# Patient Record
Sex: Male | Born: 1960 | Race: Black or African American | Hispanic: No | Smoking: Never smoker
Health system: Southern US, Community
[De-identification: ages and names within clinical notes are randomized; demographics above are authoritative.]

## PROBLEM LIST (undated history)

## (undated) ENCOUNTER — Emergency Department (HOSPITAL_COMMUNITY): Disposition: A | Discharge status: Home / Self Care | Payer: BC Managed Care – PPO

## (undated) DIAGNOSIS — J302 Other seasonal allergic rhinitis: Secondary | ICD-10-CM

## (undated) DIAGNOSIS — M1990 Unspecified osteoarthritis, unspecified site: Secondary | ICD-10-CM

## (undated) DIAGNOSIS — E7800 Pure hypercholesterolemia, unspecified: Secondary | ICD-10-CM

## (undated) DIAGNOSIS — I10 Essential (primary) hypertension: Secondary | ICD-10-CM

## (undated) HISTORY — PX: HERNIA REPAIR: SHX51

## (undated) HISTORY — PX: SHOULDER SURGERY: SHX246

## (undated) HISTORY — PX: ANKLE SURGERY: SHX546

## (undated) HISTORY — PX: KNEE SURGERY: SHX244

## (undated) HISTORY — PX: HAND SURGERY: SHX662

---

## 2013-05-01 ENCOUNTER — Encounter (HOSPITAL_COMMUNITY): Payer: Self-pay | Admitting: Emergency Medicine

## 2013-05-01 ENCOUNTER — Emergency Department (HOSPITAL_COMMUNITY): Payer: BC Managed Care – PPO

## 2013-05-01 ENCOUNTER — Emergency Department (HOSPITAL_COMMUNITY)
Admission: EM | Admit: 2013-05-01 | Discharge: 2013-05-01 | Disposition: A | Discharge status: Home / Self Care | Payer: BC Managed Care – PPO | Attending: Emergency Medicine | Admitting: Emergency Medicine

## 2013-05-01 DIAGNOSIS — S199XXA Unspecified injury of neck, initial encounter: Secondary | ICD-10-CM

## 2013-05-01 DIAGNOSIS — S335XXA Sprain of ligaments of lumbar spine, initial encounter: Secondary | ICD-10-CM | POA: Insufficient documentation

## 2013-05-01 DIAGNOSIS — S0990XA Unspecified injury of head, initial encounter: Secondary | ICD-10-CM | POA: Insufficient documentation

## 2013-05-01 DIAGNOSIS — S63502A Unspecified sprain of left wrist, initial encounter: Secondary | ICD-10-CM

## 2013-05-01 DIAGNOSIS — S0993XA Unspecified injury of face, initial encounter: Secondary | ICD-10-CM | POA: Insufficient documentation

## 2013-05-01 DIAGNOSIS — I10 Essential (primary) hypertension: Secondary | ICD-10-CM | POA: Insufficient documentation

## 2013-05-01 DIAGNOSIS — S39012A Strain of muscle, fascia and tendon of lower back, initial encounter: Secondary | ICD-10-CM

## 2013-05-01 DIAGNOSIS — Y939 Activity, unspecified: Secondary | ICD-10-CM | POA: Insufficient documentation

## 2013-05-01 DIAGNOSIS — Y9229 Other specified public building as the place of occurrence of the external cause: Secondary | ICD-10-CM | POA: Insufficient documentation

## 2013-05-01 DIAGNOSIS — Z8639 Personal history of other endocrine, nutritional and metabolic disease: Secondary | ICD-10-CM | POA: Insufficient documentation

## 2013-05-01 DIAGNOSIS — W1809XA Striking against other object with subsequent fall, initial encounter: Secondary | ICD-10-CM | POA: Insufficient documentation

## 2013-05-01 DIAGNOSIS — IMO0002 Reserved for concepts with insufficient information to code with codable children: Secondary | ICD-10-CM | POA: Insufficient documentation

## 2013-05-01 DIAGNOSIS — S63509A Unspecified sprain of unspecified wrist, initial encounter: Secondary | ICD-10-CM | POA: Insufficient documentation

## 2013-05-01 DIAGNOSIS — R42 Dizziness and giddiness: Secondary | ICD-10-CM | POA: Insufficient documentation

## 2013-05-01 DIAGNOSIS — Z862 Personal history of diseases of the blood and blood-forming organs and certain disorders involving the immune mechanism: Secondary | ICD-10-CM | POA: Insufficient documentation

## 2013-05-01 DIAGNOSIS — W010XXA Fall on same level from slipping, tripping and stumbling without subsequent striking against object, initial encounter: Secondary | ICD-10-CM | POA: Insufficient documentation

## 2013-05-01 HISTORY — DX: Essential (primary) hypertension: I10

## 2013-05-01 HISTORY — DX: Unspecified osteoarthritis, unspecified site: M19.90

## 2013-05-01 HISTORY — DX: Pure hypercholesterolemia, unspecified: E78.00

## 2013-05-01 MED ORDER — HYDROCODONE-ACETAMINOPHEN 5-325 MG PO TABS: 1.0000 | ORAL_TABLET | Freq: Four times a day (QID) | ORAL | Status: DC | PRN

## 2013-05-01 MED ORDER — IBUPROFEN 600 MG PO TABS: 600.0000 mg | ORAL_TABLET | Freq: Four times a day (QID) | ORAL | Status: DC | PRN

## 2013-05-01 MED ORDER — OXYCODONE-ACETAMINOPHEN 5-325 MG PO TABS
1.0000 | ORAL_TABLET | Freq: Once | ORAL | Status: AC
  Administered 2013-05-01: 1 via ORAL
  Filled 2013-05-01: qty 1

## 2013-05-01 NOTE — ED Notes (Signed)
Per EMS[facility], now reporting pain to right arm and lower back. Minor pain on palpation; no step offs. Denies neck pain AO x4. 177/109. Hx: HTN, states took RX this AM. 92 NSR. 100% RA.

## 2013-05-01 NOTE — Discharge Instructions (Signed)
Ice your wrist several times a day. Avoid the use until improved. Ibuprofen for pain. Norco for severe pain. Rest. Follow up with your doctor on Monday. Return if worsening.   Head Injury, Adult You have had a head injury that does not appear serious at this time. A concussion is a state of changed mental ability, usually from a blow to the head. You should take clear liquids for the rest of the day and then resume your regular diet. You should not take sedatives or alcoholic beverages for as long as directed by your caregiver after discharge. After injuries such as yours, most problems occur within the first 24 hours. SYMPTOMS These minor symptoms may be experienced after discharge:  Memory difficulties.  Dizziness.  Headaches.  Double vision.  Hearing difficulties.  Depression.  Tiredness.  Weakness.  Difficulty with concentration. If you experience any of these problems, you should not be alarmed. A concussion requires a few days for recovery. Many patients with head injuries frequently experience such symptoms. Usually, these problems disappear without medical care. If symptoms last for more than one day, notify your caregiver. See your caregiver sooner if symptoms are becoming worse rather than better. HOME CARE INSTRUCTIONS   During the next 24 hours you must stay with someone who can watch you for the warning signs listed below. Although it is unlikely that serious side effects will occur, you should be aware of signs and symptoms which may necessitate your return to this location. Side effects may occur up to 7  10 days following the injury. It is important for you to carefully monitor your condition and contact your caregiver or seek immediate medical attention if there is a change in your condition. SEEK IMMEDIATE MEDICAL CARE IF:   There is confusion or drowsiness.  You can not awaken the injured person.  There is nausea (feeling sick to your stomach) or continued,  forceful vomiting.  You notice dizziness or unsteadiness which is getting worse, or inability to walk.  You have convulsions or unconsciousness.  You experience severe, persistent headaches not relieved by over-the-counter or prescription medicines for pain. (Do not take aspirin as this impairs clotting abilities). Take other pain medications only as directed.  You can not use arms or legs normally.  There is clear or bloody discharge from the nose or ears. MAKE SURE YOU:   Understand these instructions.  Will watch your condition.  Will get help right away if you are not doing well or get worse. Document Released: 04/15/2005 Document Revised: 07/08/2011 Document Reviewed: 03/03/2009 [facility] Patient Information 2014 [place], [place].  Wrist Pain Wrist injuries are frequent in adults and children. A sprain is an injury to the ligaments that hold your bones together. A strain is an injury to muscle or muscle cord-like structures (tendons) from stretching or pulling. Generally, when wrists are moderately tender to touch following a fall or injury, a break in the bone (fracture) may be present. Most wrist sprains or strains are better in 3 to 5 days, but complete healing may take several weeks. HOME CARE INSTRUCTIONS   Put ice on the injured area.  Put ice in a plastic bag.  Place a towel between your skin and the bag.  Leave the ice on for 15-20 minutes, 03-04 times a day, for the first 2 days.  Keep your arm raised above the level of your heart whenever possible to reduce swelling and pain.  Rest the injured area for at least 48 hours or as  directed by your caregiver.  If a splint or elastic bandage has been applied, use it for as long as directed by your caregiver or until seen by a caregiver for a follow-up exam.  Only take over-the-counter or prescription medicines for pain, discomfort, or fever as directed by your caregiver.  Keep all follow-up appointments. You may need to  follow up with a specialist or have follow-up X-rays. Improvement in pain level is not a guarantee that you did not fracture a bone in your wrist. The only way to determine whether or not you have a broken bone is by X-ray. SEEK IMMEDIATE MEDICAL CARE IF:   Your fingers are swollen, very red, white, or cold and blue.  Your fingers are numb or tingling.  You have increasing pain.  You have difficulty moving your fingers. MAKE SURE YOU:   Understand these instructions.  Will watch your condition.  Will get help right away if you are not doing well or get worse. Document Released: 01/23/2005 Document Revised: 07/08/2011 Document Reviewed: 06/06/2010 [facility] Patient Information 2014 [place], [place].

## 2013-05-01 NOTE — ED Provider Notes (Signed)
Medical screening examination/treatment/procedure(s) were performed by non-physician practitioner and as supervising physician I was immediately available for consultation/collaboration.   Celene KrasJon R Ferron Ishmael, MD 05/01/13 2158

## 2013-05-01 NOTE — ED Provider Notes (Signed)
CSN: [id number]     Arrival date & time 05/01/13  1807 History   First MD Initiated Contact with Patient 05/01/13 1824     Chief Complaint  Patient presents with  . Fall   (Consider location/radiation/quality/duration/timing/severity/associated sxs/prior Treatment) HPI Manuel Melendez is a 53 y.o. male who presents emergency department after a fall. [facility] p[facility] of [facility] and fell backwards. He states he fell on to his back and then rolled down and hit his head on the ground. He states that bystanders helped him up. He denies any loss of consciousness. He states it's first felt "okay" but within the next few minutes he developed dizziness, lightheadedness, headache, and "fuzzy feeling." He also reports pain in his lower back and left wrist. He states after sitting there for a few minutes he was unable to get up due to dizziness and headaches. EMS was called and he was transported here. Patient denies being on blood thinners. He continues to have headache and dizziness. He reports mild pain in his neck, pain in his lower back and left wrist. Denies pain radiation. Pt did not take anything prior to the arrival.  Past Medical History  Diagnosis Date  . Hypertension   . Hypercholesteremia   . Arthritis    Past Surgical History  Procedure Laterality Date  . Shoulder surgery Bilateral   . Hernia repair     History reviewed. No pertinent family history. History  Substance Use Topics  . Smoking status: Never Smoker   . Smokeless tobacco: Not on file  . Alcohol Use: No    Review of Systems  Constitutional: Negative for fever and chills.  Respiratory: Negative for cough, chest tightness and shortness of breath.   Cardiovascular: Negative for chest pain, palpitations and leg swelling.  Gastrointestinal: Negative for nausea, vomiting, abdominal pain, diarrhea and abdominal distention.  Genitourinary: Negative for dysuria, urgency, frequency and hematuria.   Musculoskeletal: Positive for arthralgias, back pain, myalgias and neck pain. Negative for neck stiffness.  Skin: Negative for rash.  Allergic/Immunologic: Negative for immunocompromised state.  Neurological: Positive for dizziness, light-headedness and headaches. Negative for syncope, weakness and numbness.  All other systems reviewed and are negative.    Allergies  Review of patient's allergies indicates no known allergies.  Home Medications  No current outpatient prescriptions on file. BP 154/98  Pulse 87  Temp(Src) 98.4 F (36.9 C) (Oral)  SpO2 100% Physical Exam  Nursing note and vitals reviewed. Constitutional: He is oriented to person, place, and time. He appears well-developed and well-nourished. No distress.  HENT:  Head: Normocephalic and atraumatic.  Eyes: Conjunctivae and EOM are normal. Pupils are equal, round, and reactive to light.  Neck: Neck supple.  Cardiovascular: Normal rate, regular rhythm and normal heart sounds.   Pulmonary/Chest: Effort normal. No respiratory distress. He has no wheezes. He has no rales.  Abdominal: Soft. Bowel sounds are normal. He exhibits no distension. There is no tenderness. There is no rebound.  Musculoskeletal: He exhibits no edema.  Tenderness to the midline and left perivertebral lumbar spine. No bruising, swelling, stepoffs. Pain with bilateral straight leg raise. Mild swelling and tenderness over left dorsal wrist. Pain with any ROM of the wrist. 5/5 and equal lower extremity strength. 2+ and equal patellar reflexes bilaterally. Pt able to dorsiflex bilateral toes and feet with good strength against resistance. Equal sensation bilaterally over thighs and lower legs.   Neurological: He is alert and oriented to person, place,  and time. No cranial nerve deficit. Coordination normal.  5/5 and equal upper and lower extremity strength bilaterally. Equal grip strength bilaterally. Normal finger to nose and heel to shin. No pronator drift.    Skin: Skin is warm and dry.    ED Course  Procedures (including critical care time) Labs Review Labs Reviewed - No data to display Imaging Review Dg Lumbar Spine Complete  05/01/2013   CLINICAL DATA:  Fall today.  Low back pain.  EXAM: LUMBAR SPINE - COMPLETE 4+ VIEW  COMPARISON:  None.  FINDINGS: There is transitional lumbosacral anatomy. There are small ribs at L1 with 5 lumbar type vertebral bodies. The alignment is normal. There is no evidence of fracture or pars defect. Mild intervertebral spurring is present at the lower 3 levels. There is mild facet disease inferiorly as well.  IMPRESSION: No evidence of acute lumbar spine injury or malalignment.   Electronically Signed   By: Roxy Horseman M.D.   On: 05/01/2013 20:05   Dg Wrist Complete Left  05/01/2013   CLINICAL DATA:  Wrist pain status post fall.  EXAM: LEFT WRIST - COMPLETE 3+ VIEW  COMPARISON:  None.  FINDINGS: The mineralization and alignment are normal. There is no evidence of acute fracture or dislocation. The joint spaces are maintained. There appears to be some dorsal soft tissue swelling.  IMPRESSION: No acute osseous findings identified. Dorsal soft tissue swelling noted.   Electronically Signed   By: Roxy Horseman M.D.   On: 05/01/2013 20:06   Ct Head Wo Contrast  05/01/2013   CLINICAL DATA:  Fall.  Head injury.  Neck pain.  EXAM: CT HEAD WITHOUT CONTRAST  CT CERVICAL SPINE WITHOUT CONTRAST  TECHNIQUE: Multidetector CT imaging of the head and cervical spine was performed following the standard protocol without intravenous contrast. Multiplanar CT image reconstructions of the cervical spine were also generated.  COMPARISON:  None.  FINDINGS: CT HEAD FINDINGS  No evidence of intracranial hemorrhage, brain edema, or other signs of acute infarction. No evidence of intracranial mass lesion or mass effect. No abnormal extraaxial fluid collections identified. Ventricles are normal in size. No skull abnormality identified.  CT CERVICAL SPINE  FINDINGS  No evidence of acute fracture, subluxation, or prevertebral soft tissue swelling.  Mild degenerative disc disease seen from levels C3-C7. Mild cervical kyphosis noted. No evidence of facet DJD or other significant bone abnormality.  IMPRESSION: Negative noncontrast head CT.  No evidence of acute cervical spine fracture or subluxation. Mild cervical degenerative disc disease.   Electronically Signed   By: Myles Rosenthal M.D.   On: 05/01/2013 20:23   Ct Cervical Spine Wo Contrast  05/01/2013   CLINICAL DATA:  Fall.  Head injury.  Neck pain.  EXAM: CT HEAD WITHOUT CONTRAST  CT CERVICAL SPINE WITHOUT CONTRAST  TECHNIQUE: Multidetector CT imaging of the head and cervical spine was performed following the standard protocol without intravenous contrast. Multiplanar CT image reconstructions of the cervical spine were also generated.  COMPARISON:  None.  FINDINGS: CT HEAD FINDINGS  No evidence of intracranial hemorrhage, brain edema, or other signs of acute infarction. No evidence of intracranial mass lesion or mass effect. No abnormal extraaxial fluid collections identified. Ventricles are normal in size. No skull abnormality identified.  CT CERVICAL SPINE FINDINGS  No evidence of acute fracture, subluxation, or prevertebral soft tissue swelling.  Mild degenerative disc disease seen from levels C3-C7. Mild cervical kyphosis noted. No evidence of facet DJD or other significant bone abnormality.  IMPRESSION:  Negative noncontrast head CT.  No evidence of acute cervical spine fracture or subluxation. Mild cervical degenerative disc disease.   Electronically Signed   By: Myles Rosenthal M.D.   On: 05/01/2013 20:23    EKG Interpretation   None       MDM   1. Minor head injury, initial encounter   2. Lumbar spine strain, initial encounter   3. Wrist sprain, left, initial encounter     Patient emergency department with mechanical fall after slipping and falling on water. Patient is complaining of headache,  dizziness, pain in the wrist and lower back. X-rays of the wrist and lumbar spine obtained and are negative. CT of the head and cervical spine obtained and are negative. Patient treated emergency department with a Percocet. I suspect his headache is due to his head injury and possible mild concussion. He'll be discharged home with pain medications and NSAIDs, followup with primary care Dr. on Monday. His vital signs are normal except for mild hypertension, last blood pressure is 142/94. Results and plan discussed with patient, will followup.  Filed Vitals:   05/01/13 1814 05/01/13 1900 05/01/13 2030  BP: 154/98 155/100 142/94  Pulse: 87 75 64  Temp: 98.4 F (36.9 C)    TempSrc: Oral    SpO2: 100% 100% 100%     Lottie Mussel, PA-C 05/01/13 2141

## 2013-09-28 ENCOUNTER — Encounter (HOSPITAL_COMMUNITY): Payer: Self-pay | Admitting: Emergency Medicine

## 2013-09-28 ENCOUNTER — Emergency Department (HOSPITAL_COMMUNITY)
Admission: EM | Admit: 2013-09-28 | Discharge: 2013-09-28 | Disposition: A | Discharge status: Home / Self Care | Payer: BC Managed Care – PPO | Attending: Emergency Medicine | Admitting: Emergency Medicine

## 2013-09-28 ENCOUNTER — Emergency Department (HOSPITAL_COMMUNITY): Payer: BC Managed Care – PPO

## 2013-09-28 DIAGNOSIS — E7800 Pure hypercholesterolemia, unspecified: Secondary | ICD-10-CM | POA: Insufficient documentation

## 2013-09-28 DIAGNOSIS — I10 Essential (primary) hypertension: Secondary | ICD-10-CM | POA: Insufficient documentation

## 2013-09-28 DIAGNOSIS — J45909 Unspecified asthma, uncomplicated: Secondary | ICD-10-CM | POA: Insufficient documentation

## 2013-09-28 DIAGNOSIS — IMO0001 Reserved for inherently not codable concepts without codable children: Secondary | ICD-10-CM | POA: Insufficient documentation

## 2013-09-28 DIAGNOSIS — Z79899 Other long term (current) drug therapy: Secondary | ICD-10-CM | POA: Insufficient documentation

## 2013-09-28 DIAGNOSIS — Z23 Encounter for immunization: Secondary | ICD-10-CM | POA: Insufficient documentation

## 2013-09-28 DIAGNOSIS — L02519 Cutaneous abscess of unspecified hand: Secondary | ICD-10-CM | POA: Insufficient documentation

## 2013-09-28 DIAGNOSIS — L03019 Cellulitis of unspecified finger: Secondary | ICD-10-CM | POA: Insufficient documentation

## 2013-09-28 DIAGNOSIS — L03012 Cellulitis of left finger: Secondary | ICD-10-CM

## 2013-09-28 DIAGNOSIS — Z792 Long term (current) use of antibiotics: Secondary | ICD-10-CM | POA: Insufficient documentation

## 2013-09-28 DIAGNOSIS — M129 Arthropathy, unspecified: Secondary | ICD-10-CM | POA: Insufficient documentation

## 2013-09-28 DIAGNOSIS — Z8639 Personal history of other endocrine, nutritional and metabolic disease: Secondary | ICD-10-CM | POA: Insufficient documentation

## 2013-09-28 DIAGNOSIS — Z862 Personal history of diseases of the blood and blood-forming organs and certain disorders involving the immune mechanism: Secondary | ICD-10-CM | POA: Insufficient documentation

## 2013-09-28 DIAGNOSIS — Z0289 Encounter for other administrative examinations: Secondary | ICD-10-CM | POA: Insufficient documentation

## 2013-09-28 MED ORDER — CEPHALEXIN 250 MG PO CAPS: 250.0000 mg | ORAL_CAPSULE | Freq: Four times a day (QID) | ORAL | Status: DC

## 2013-09-28 MED ORDER — LIDOCAINE HCL (PF) 1 % IJ SOLN
INTRAMUSCULAR | Status: AC
  Administered 2013-09-28: 05:00:00
  Filled 2013-09-28: qty 5

## 2013-09-28 MED ORDER — TETANUS-DIPHTH-ACELL PERTUSSIS 5-2.5-18.5 LF-MCG/0.5 IM SUSP
0.5000 mL | Freq: Once | INTRAMUSCULAR | Status: AC
  Administered 2013-09-28: 0.5 mL via INTRAMUSCULAR
  Filled 2013-09-28: qty 0.5

## 2013-09-28 NOTE — Discharge Instructions (Signed)
Please report back to the ED tomorrow for the wound to be re-assessed Please rest and stay hydrated Please soak the finger in warm water and massage at least 3 times per day Please avoid any physical or strenuous activity Please monitor symptoms closely and if symptoms are to worsen or change (fever greater than 101, chills, sweating, nausea, vomiting, chest pain, shortness of breath, difficulty breathing, swelling to the finger, red streaks, hot to the touch, bleeding, drainage of pus, weakness, numbness, tingling, loss of sensation to the finger, fall, injury) please report back to the ED immediately    Cellulitis Cellulitis is an infection of the skin and the tissue under the skin. The infected area is usually red and tender. This happens most often in the arms and lower legs. HOME CARE   Take your antibiotic medicine as told. Finish the medicine even if you start to feel better.  Keep the infected arm or leg raised (elevated).  Put a warm cloth on the area up to 4 times per day.  Only take medicines as told by your doctor.  Keep all doctor visits as told. GET HELP RIGHT AWAY IF:   You have a fever.  You feel very sleepy.  You throw up (vomit) or have watery poop (diarrhea).  You feel sick and have muscle aches and pains.  You see red streaks on the skin coming from the infected area.  Your red area gets bigger or turns a dark color.  Your bone or joint under the infected area is painful after the skin heals.  Your infection comes back in the same area or different area.  You have a puffy (swollen) bump in the infected area.  You have new symptoms. MAKE SURE YOU:   Understand these instructions.  Will watch your condition.  Will get help right away if you are not doing well or get worse. Document Released: 10/02/2007 Document Revised: 10/15/2011 Document Reviewed: 07/01/2011 [facility] Patient Information 2014 [place], [place].   Paronychia Paronychia is an  inflammatory reaction involving the folds of the skin surrounding the fingernail. This is commonly caused by an infection in the skin around a nail. The most common cause of paronychia is frequent wetting of the hands (as seen with bartenders, food servers, nurses or others who wet their hands). This makes the skin around the fingernail susceptible to infection by bacteria (germs) or fungus. Other predisposing factors are:  Aggressive manicuring.  Nail biting.  Thumb sucking. The most common cause is a staphylococcal (a type of germ) infection, or a fungal (Candida) infection. When caused by a germ, it usually comes on suddenly with redness, swelling, pus and is often painful. It may get under the nail and form an abscess (collection of pus), or form an abscess around the nail. If the nail itself is infected with a fungus, the treatment is usually prolonged and may require oral medicine for up to one year. Your caregiver will determine the length of time treatment is required. The paronychia caused by bacteria (germs) may largely be avoided by not pulling on hangnails or picking at cuticles. When the infection occurs at the tips of the finger it is called felon. When the cause of paronychia is from the herpes simplex virus (HSV) it is called herpetic whitlow. TREATMENT  When an abscess is present treatment is often incision and drainage. This means that the abscess must be cut open so the pus can get out. When this is done, the following home care instructions  should be followed. HOME CARE INSTRUCTIONS   It is important to keep the affected fingers very dry. Rubber or plastic gloves over cotton gloves should be used whenever the hand must be placed in water.  Keep wound clean, dry and dressed as suggested by your caregiver between warm soaks or warm compresses.  Soak in warm water for fifteen to twenty minutes three to four times per day for bacterial infections. Fungal infections are very difficult  to treat, so often require treatment for long periods of time.  For bacterial (germ) infections take antibiotics (medicine which kill germs) as directed and finish the prescription, even if the problem appears to be solved before the medicine is gone.  Only take over-the-counter or prescription medicines for pain, discomfort, or fever as directed by your caregiver. SEEK IMMEDIATE MEDICAL CARE IF:  You have redness, swelling, or increasing pain in the wound.  You notice pus coming from the wound.  You have a fever.  You notice a bad smell coming from the wound or dressing. Document Released: 10/09/2000 Document Revised: 07/08/2011 Document Reviewed: 06/10/2008 [facility] Patient Information 2014 [place], [place].

## 2013-09-28 NOTE — ED Provider Notes (Signed)
Medical screening examination/treatment/procedure(s) were conducted as a shared visit with non-physician practitioner(s) and myself.  I personally evaluated the patient during the encounter.  Pt with recent pain, swelling to left fifth finger.  He has been digging in the finger, concerned that he had a splinter, unable to extract   EKG Interpretation None      INCISION AND DRAINAGE Performed by: Olivia Mackie Consent: Verbal consent obtained. Risks and benefits: risks, benefits and alternatives were discussed Type: abscess  Body area: left lateral distal 5th finger  Anesthesia: digital block  Incision was made with a scalpel.  Local anesthetic: lidocaine 1% without epinephrine  Anesthetic total: 3 ml  Complexity: complex Blunt dissection to break up loculations.  No foreign bodies noted  Drainage: purulent  Drainage amount:small  Packing material: none  Patient tolerance: Patient tolerated the procedure well with no immediate complications.     Olivia Mackie, MD 09/28/13 [phone]

## 2013-09-28 NOTE — ED Provider Notes (Signed)
CSN: [id number]     Arrival date & time 09/28/13  0130 History   First MD Initiated Contact with Patient 09/28/13 0228     Chief Complaint  Patient presents with  . Hand Pain     (Consider location/radiation/quality/duration/timing/severity/associated sxs/prior Treatment) The history is provided by the patient. No language interpreter was used.  Manuel Melendez is a 53 year old male with past medical history of hypertension, hypercholesterolemia, asthma presenting to the ED with left small finger discomfort that has been ongoing for the past week. Patient reported approximately one week ago he was lifting wood towards and stated that he believes he had a splinter in his left pinky. Stated that he tried to remove the splinter but was unsuccessful. Stated that over the past week he has had progressive swelling to the left pinky finger and the pain is gone progressively worse described as a constant throbbing sensation. Reported that there is mild clear fluid drainage when he tries to remove the splinter. Stated that he only start to soak his finger in warm water today. Denied numbness, tingling, loss of sensation, bleeding, pus drainage. PCP Dr. Margo Aye  Past Medical History  Diagnosis Date  . Hypertension   . Hypercholesteremia   . Arthritis    Past Surgical History  Procedure Laterality Date  . Shoulder surgery Bilateral   . Hernia repair     No family history on file. History  Substance Use Topics  . Smoking status: Never Smoker   . Smokeless tobacco: Not on file  . Alcohol Use: No    Review of Systems  Musculoskeletal: Positive for arthralgias (left small finger).  Neurological: Negative for weakness and numbness.      Allergies  Review of patient's allergies indicates no known allergies.  Home Medications   Prior to Admission medications   Medication Sig Start Date End Date Taking? Authorizing Provider  dexlansoprazole (DEXILANT) 60 MG capsule Take 60 mg by mouth daily.    Yes Historical Provider, MD  oxyCODONE-acetaminophen (PERCOCET) 10-325 MG per tablet Take 1 tablet by mouth every 4 (four) hours as needed for pain.   Yes Historical Provider, MD  PRESCRIPTION MEDICATION Take 1 tablet by mouth daily. Combo blood pressure medication   Yes Historical Provider, MD  tamsulosin (FLOMAX) 0.4 MG CAPS capsule Take 0.4 mg by mouth daily.   Yes Historical Provider, MD   There were no vitals taken for this visit. Physical Exam  Nursing note and vitals reviewed. Constitutional: He is oriented to person, place, and time. He appears well-developed and well-nourished. No distress.  HENT:  Head: Normocephalic and atraumatic.  Eyes: Conjunctivae and EOM are normal. Right eye exhibits no discharge. Left eye exhibits no discharge.  Neck: Normal range of motion. Neck supple.  Cardiovascular: Normal rate, regular rhythm and normal heart sounds.  Exam reveals no friction rub.   No murmur heard. Pulses:      Radial pulses are 2+ on the right side, and 2+ on the left side.  Cap refill less than 3 seconds  Pulmonary/Chest: Effort normal and breath sounds normal. No respiratory distress. He has no wheezes. He has no rales.  Musculoskeletal: Normal range of motion.  Swelling, erythema identified to the distal phalanx of the left small finger. Negative active drainage, bleeding, serous fluid identified. Discomfort upon palpation to the finger pad. Full flexion extension identified to the left small finger. Negative warmth upon palpation. Negative red streaks running down the finger.   Neurological: He is alert and oriented to person,  place, and time. No cranial nerve deficit. He exhibits normal muscle tone. Coordination normal.  Cranial nerves III-XII grossly intact Strength 5+/5+ to upper extremities bilaterally with resistance applied, equal distribution noted Strength intact to MCP, PIP, DIP joints of left hand Equal grip strength bilaterally Sensation intact with differentiation  sharp and dull touch  Skin: Skin is warm and dry. He is not diaphoretic. There is erythema.  Psychiatric: He has a normal mood and affect. His behavior is normal. Thought content normal.    ED Course  Procedures (including critical care time) Labs Review Labs Reviewed - No data to display  Imaging Review Dg Finger Little Left  09/28/2013   CLINICAL DATA:  Little finger pain after recent splinter.  EXAM: LEFT LITTLE FINGER 2+V  COMPARISON:  None.  FINDINGS: There is no evidence of fracture or dislocation. There is no evidence of arthropathy or other focal bone abnormality. Focal soft tissue swelling of the distal fifth phalanx without subcutaneous gas or radiopaque foreign bodies.  IMPRESSION: Focal fifth distal phalanx soft tissue swelling without subcutaneous gas or radiopaque foreign bodies.  No acute fracture deformity or dislocation.   Electronically Signed   By: Awilda Metroourtnay  Bloomer   On: 09/28/2013 03:24     EKG Interpretation None      MDM   Final diagnoses:  None   Plain film of left little finger noted focal fifth distal phalanx soft tissue swelling without subcutaneous gas or radiopaque foreign bodies-no acute fracture deformity or dislocation noted. Patient seen and assessed by attending physician, Dr. Norlene Campbelltter who recommended incision and drainage to be performed-I&D to be performed by attending physician. Transfer of care to attending physician, discharge paperwork complete with plan for discharge with antibiotics and one day follow up.     Raymon MuttonMarissa Edword Cu, PA-C 09/28/13 0401

## 2013-09-28 NOTE — ED Notes (Signed)
Patient with left pinky pain.  Patient states that he may have a splinter in the pinky, he has been trying to get it out of the pinky.  Area is swollen, painful and serous drainage seen from area.

## 2013-09-28 NOTE — ED Notes (Signed)
Pt. Transported to xray 

## 2013-09-29 ENCOUNTER — Encounter (HOSPITAL_COMMUNITY): Payer: Self-pay | Admitting: Emergency Medicine

## 2013-09-29 ENCOUNTER — Emergency Department (HOSPITAL_COMMUNITY)
Admission: EM | Admit: 2013-09-29 | Discharge: 2013-09-29 | Disposition: A | Discharge status: Home / Self Care | Payer: BC Managed Care – PPO | Attending: Emergency Medicine | Admitting: Emergency Medicine

## 2013-09-29 DIAGNOSIS — L03012 Cellulitis of left finger: Secondary | ICD-10-CM

## 2013-09-29 NOTE — Discharge Instructions (Signed)
Please read and follow all provided instructions.  Your diagnoses today include:  1. Paronychia of finger of left hand     Tests performed today include:  Vital signs. See below for your results today.   Medications prescribed:   None  Take any prescribed medications only as directed.   Home care instructions:   Follow any educational materials contained in this packet  Continue antibiotics and pain medications.   Soak your finger twice a day in warm water.  Follow-up instructions: See your primary care doctor in 48 hours for a recheck if your symptoms are not significantly improved.  Return instructions:  Return to the Emergency Department if you have:  Fever  Worsening symptoms  Worsening pain  Worsening swelling  Redness of the skin that moves away from the affected area, especially if it streaks away from the affected area   Any other emergent concerns  Your vital signs today were: BP 148/99   Pulse 76   Temp(Src) 97.7 F (36.5 C) (Oral)   Resp 18   SpO2 98% If your blood pressure (BP) was elevated above 135/85 this visit, please have this repeated by your doctor within one month. --------------

## 2013-09-29 NOTE — ED Notes (Signed)
Left hand pinky finger wound recheck. Pt states it looks much better

## 2013-09-29 NOTE — ED Provider Notes (Signed)
Medical screening examination/treatment/procedure(s) were performed by non-physician practitioner and as supervising physician I was immediately available for consultation/collaboration.   EKG Interpretation None        Braxen Dobek M Markesia Crilly, MD 09/29/13 0503 

## 2013-09-29 NOTE — ED Provider Notes (Signed)
CSN: [id number]     Arrival date & time 09/28/13  2355 History   First MD Initiated Contact with Patient 09/29/13 0043     Chief Complaint  Patient presents with  . Wound Check     (Consider location/radiation/quality/duration/timing/severity/associated sxs/prior Treatment) HPI Comments: Patient presents for wound recheck after having incision and drainage performed of a left fifth digit paronychia. Patient was started on Keflex. He is on pain medication at home. Patient is at the swelling is still present but improving. Pain is much better than it was yesterday. He denies any fever or other systemic symptoms of illness. Onset of symptoms gradual. Course is improving. Nothing makes symptoms better or worse  Patient is a 53 y.o. male presenting with wound check. The history is provided by the patient.  Wound Check Associated symptoms include myalgias. Pertinent negatives include no arthralgias, joint swelling, neck pain, numbness or weakness.    Past Medical History  Diagnosis Date  . Hypertension   . Hypercholesteremia   . Arthritis    Past Surgical History  Procedure Laterality Date  . Shoulder surgery Bilateral   . Hernia repair     History reviewed. No pertinent family history. History  Substance Use Topics  . Smoking status: Never Smoker   . Smokeless tobacco: Not on file  . Alcohol Use: No    Review of Systems  Constitutional: Negative for activity change.  Musculoskeletal: Positive for myalgias. Negative for arthralgias, back pain, gait problem, joint swelling and neck pain.  Skin: Negative for color change and wound.  Neurological: Negative for weakness and numbness.      Allergies  Review of patient's allergies indicates no known allergies.  Home Medications   Prior to Admission medications   Medication Sig Start Date End Date Taking? Authorizing Provider  cephALEXin (KEFLEX) 250 MG capsule Take 1 capsule (250 mg total) by mouth 4 (four) times daily. 09/28/13   Yes Marissa Sciacca, PA-C  dexlansoprazole (DEXILANT) 60 MG capsule Take 60 mg by mouth daily.   Yes Historical Provider, MD  oxyCODONE-acetaminophen (PERCOCET) 10-325 MG per tablet Take 1 tablet by mouth every 4 (four) hours as needed for pain.   Yes Historical Provider, MD  PRESCRIPTION MEDICATION Take 1 tablet by mouth daily. Combo blood pressure medication   Yes Historical Provider, MD  tamsulosin (FLOMAX) 0.4 MG CAPS capsule Take 0.4 mg by mouth daily.   Yes Historical Provider, MD   BP 134/89  Pulse 70  Temp(Src) 97.7 F (36.5 C) (Oral)  Resp 16  SpO2 100%  Physical Exam  Nursing note and vitals reviewed. Constitutional: He appears well-developed and well-nourished.  HENT:  Head: Normocephalic and atraumatic.  Eyes: Conjunctivae are normal.  Neck: Normal range of motion. Neck supple.  Cardiovascular: Normal pulses.   Musculoskeletal: He exhibits edema and tenderness.  Patient with healed incision wound noted to the ulnar aspect of the left fifth digit. There is mild swelling of the paronychia. No erythema or warmth. Area appears to be well-healing. Normal capillary refill. Patient with full range of motion of his fifth digit.  Neurological: He is alert. No sensory deficit.  Motor, sensation, and vascular distal to the injury is fully intact.   Skin: Skin is warm and dry.  Psychiatric: He has a normal mood and affect.    ED Course  Procedures (including critical care time) Labs Review Labs Reviewed - No data to display  Imaging Review Dg Finger Little Left  09/28/2013   CLINICAL DATA:  Little finger  pain after recent splinter.  EXAM: LEFT LITTLE FINGER 2+V  COMPARISON:  None.  FINDINGS: There is no evidence of fracture or dislocation. There is no evidence of arthropathy or other focal bone abnormality. Focal soft tissue swelling of the distal fifth phalanx without subcutaneous gas or radiopaque foreign bodies.  IMPRESSION: Focal fifth distal phalanx soft tissue swelling  without subcutaneous gas or radiopaque foreign bodies.  No acute fracture deformity or dislocation.   Electronically Signed   By: Awilda Metroourtnay  Bloomer   On: 09/28/2013 03:24     EKG Interpretation None      1:41 AM Patient seen and examined. Wound appears well healing. Patient is on antibiotics and perform soaks. Patient to followup with PCP in 2 days, return to the emergency department with worsening symptoms. He is on pain medication at home.  Vital signs reviewed and are as follows: Filed Vitals:   09/29/13 0135  BP: 134/89  Pulse: 70  Temp:   Resp: 16       MDM   Final diagnoses:  Paronychia of finger of left hand   Patient here for wound recheck, finger appears well healing. Patient reports improvement. Patient to followup as instructed above.    Renne CriglerJoshua Naijah Lacek, PA-C 09/29/13 [phone]

## 2016-06-14 ENCOUNTER — Encounter (HOSPITAL_COMMUNITY): Payer: Self-pay | Admitting: Emergency Medicine

## 2016-06-14 ENCOUNTER — Emergency Department (HOSPITAL_COMMUNITY)
Admission: EM | Admit: 2016-06-14 | Discharge: 2016-06-14 | Disposition: A | Discharge status: Home / Self Care | Payer: No Typology Code available for payment source | Attending: Emergency Medicine | Admitting: Emergency Medicine

## 2016-06-14 ENCOUNTER — Emergency Department (HOSPITAL_COMMUNITY): Payer: No Typology Code available for payment source

## 2016-06-14 DIAGNOSIS — M25532 Pain in left wrist: Secondary | ICD-10-CM | POA: Diagnosis not present

## 2016-06-14 DIAGNOSIS — Z79899 Other long term (current) drug therapy: Secondary | ICD-10-CM | POA: Insufficient documentation

## 2016-06-14 DIAGNOSIS — Y999 Unspecified external cause status: Secondary | ICD-10-CM | POA: Diagnosis not present

## 2016-06-14 DIAGNOSIS — I10 Essential (primary) hypertension: Secondary | ICD-10-CM | POA: Diagnosis not present

## 2016-06-14 DIAGNOSIS — M7541 Impingement syndrome of right shoulder: Secondary | ICD-10-CM | POA: Insufficient documentation

## 2016-06-14 DIAGNOSIS — M25511 Pain in right shoulder: Secondary | ICD-10-CM | POA: Diagnosis present

## 2016-06-14 DIAGNOSIS — M545 Low back pain, unspecified: Secondary | ICD-10-CM

## 2016-06-14 DIAGNOSIS — Y92410 Unspecified street and highway as the place of occurrence of the external cause: Secondary | ICD-10-CM | POA: Insufficient documentation

## 2016-06-14 DIAGNOSIS — Y939 Activity, unspecified: Secondary | ICD-10-CM | POA: Insufficient documentation

## 2016-06-14 MED ORDER — NAPROXEN 500 MG PO TABS: 500.0000 mg | ORAL_TABLET | Freq: Two times a day (BID) | ORAL | 0 refills | Status: DC

## 2016-06-14 MED ORDER — IBUPROFEN 400 MG PO TABS
600.0000 mg | ORAL_TABLET | Freq: Once | ORAL | Status: AC
  Administered 2016-06-14: 600 mg via ORAL
  Filled 2016-06-14: qty 1

## 2016-06-14 MED ORDER — METHOCARBAMOL 500 MG PO TABS: 500.0000 mg | ORAL_TABLET | Freq: Two times a day (BID) | ORAL | 0 refills | Status: AC

## 2016-06-14 MED ORDER — OXYCODONE-ACETAMINOPHEN 5-325 MG PO TABS
2.0000 | ORAL_TABLET | Freq: Once | ORAL | Status: AC
  Administered 2016-06-14: 2 via ORAL
  Filled 2016-06-14: qty 2

## 2016-06-14 NOTE — ED Notes (Signed)
Pt wheeled out and driven home by family. Pt advised to follow up with orthopedist and do home stretches.

## 2016-06-14 NOTE — ED Notes (Signed)
Called pt's name for triage, no answer. 

## 2016-06-14 NOTE — Discharge Instructions (Addendum)
Your right shoulder x-ray showed rotator cuff impingement. Please wear the sling and immobilizer for 2 days ONLY to allow inflammation to decrease. After 2 days please stop wearing the sling and immobilizer, if you continue wearing the sling and immobilizer your shoulder might stiffen up and you may lose range of motion. Please see attached shoulder exercises, you may start doing this as soon as you can tolerate the pain. Continue taking your home Percocet, and add naproxen for additional pain control. Ice your shoulder to decrease inflammation. Follow up with orthopedic surgeon for reevaluation in 7-10 days, you may need additional imaging and outpatient treatment.  Your left wrist x-ray showed a possible radial styloid fracture, given that the imaging wasn't 100% conclusive we will treat it as a fracture and have you follow up with a hand surgeon for re-evaluation.  Make an appointment with hand surgeon in 7-10 days.  Please wear your wrist splint until you are evaluated and cleared by hand surgeon.   Your right sided back pain is most likely due to muscular spasms, you have been prescribed robaxin (muscle relaxer) which will help with tightness and spasms.  Percocet and naproxen will also help with the pain.   Your blood pressure was elevated today (159/105) because you did not take your blood pressure medicine today.  Please make sure you are compliant with your blood pressure medications.
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
## 2016-06-14 NOTE — ED Notes (Signed)
Pt reports he will call family to drive him home
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
## 2016-06-14 NOTE — Progress Notes (Signed)
Orthopedic Tech Progress Note Patient Details:  Cira ServantDavid Astle 06/20/1960 [id number]  Ortho Devices Type of Ortho Device: Ace wrap, Ulna gutter splint, Shoulder immobilizer Ortho Device/Splint Interventions: Application   Saul FordyceJennifer C Sydell Prowell 06/14/2016, 4:37 PM

## 2016-06-14 NOTE — ED Notes (Signed)
Pt to xray

## 2017-09-05 ENCOUNTER — Ambulatory Visit: Payer: BC Managed Care – PPO | Admitting: Podiatry

## 2017-09-16 ENCOUNTER — Ambulatory Visit: Payer: BC Managed Care – PPO | Admitting: Podiatry

## 2018-11-30 ENCOUNTER — Other Ambulatory Visit (HOSPITAL_BASED_OUTPATIENT_CLINIC_OR_DEPARTMENT_OTHER): Payer: Self-pay

## 2018-11-30 DIAGNOSIS — E669 Obesity, unspecified: Secondary | ICD-10-CM

## 2018-11-30 DIAGNOSIS — R0683 Snoring: Secondary | ICD-10-CM

## 2018-11-30 DIAGNOSIS — R5383 Other fatigue: Secondary | ICD-10-CM

## 2018-11-30 DIAGNOSIS — G4730 Sleep apnea, unspecified: Secondary | ICD-10-CM

## 2018-11-30 DIAGNOSIS — G4710 Hypersomnia, unspecified: Secondary | ICD-10-CM

## 2018-12-09 ENCOUNTER — Other Ambulatory Visit (HOSPITAL_COMMUNITY)
Admission: RE | Admit: 2018-12-09 | Discharge: 2018-12-09 | Disposition: A | Discharge status: Home / Self Care | Payer: BC Managed Care – PPO | Source: Ambulatory Visit | Attending: Internal Medicine | Admitting: Internal Medicine

## 2018-12-09 DIAGNOSIS — Z01812 Encounter for preprocedural laboratory examination: Secondary | ICD-10-CM | POA: Diagnosis not present

## 2018-12-09 DIAGNOSIS — Z20828 Contact with and (suspected) exposure to other viral communicable diseases: Secondary | ICD-10-CM | POA: Diagnosis not present

## 2018-12-09 LAB — SARS CORONAVIRUS 2 (TAT 6-24 HRS): SARS Coronavirus 2: NEGATIVE

## 2018-12-12 ENCOUNTER — Other Ambulatory Visit: Payer: Self-pay

## 2018-12-12 ENCOUNTER — Ambulatory Visit (HOSPITAL_BASED_OUTPATIENT_CLINIC_OR_DEPARTMENT_OTHER): Payer: BC Managed Care – PPO | Attending: Internal Medicine | Admitting: Internal Medicine

## 2018-12-12 VITALS — Ht 72.0 in | Wt 304.0 lb

## 2018-12-12 DIAGNOSIS — G4730 Sleep apnea, unspecified: Secondary | ICD-10-CM

## 2018-12-12 DIAGNOSIS — G4733 Obstructive sleep apnea (adult) (pediatric): Secondary | ICD-10-CM | POA: Diagnosis not present

## 2018-12-12 DIAGNOSIS — E669 Obesity, unspecified: Secondary | ICD-10-CM | POA: Diagnosis not present

## 2018-12-12 DIAGNOSIS — R0683 Snoring: Secondary | ICD-10-CM | POA: Diagnosis present

## 2018-12-12 DIAGNOSIS — G4710 Hypersomnia, unspecified: Secondary | ICD-10-CM

## 2018-12-12 DIAGNOSIS — R5383 Other fatigue: Secondary | ICD-10-CM

## 2018-12-12 DIAGNOSIS — Z6841 Body Mass Index (BMI) 40.0 and over, adult: Secondary | ICD-10-CM | POA: Diagnosis not present

## 2018-12-14 ENCOUNTER — Other Ambulatory Visit: Payer: Self-pay

## 2018-12-19 DIAGNOSIS — R0683 Snoring: Secondary | ICD-10-CM | POA: Diagnosis not present

## 2018-12-19 NOTE — Procedures (Signed)
Patient Name: Manuel Melendez, Fill Date: 12/12/2018 Gender: Male D.O.B: 12/24/1960 Age (years): 76 Referring Provider: Stana Bunting NP Height (inches): 72 Interpreting Physician: Baird Lyons MD, ABSM Weight (lbs): 304 RPSGT: Lanae Boast BMI: 41 MRN: [id number] Neck Size: 19.00  CLINICAL INFORMATION Sleep Study Type: Split Night CPAP Indication for sleep study: Excessive Daytime Sleepiness, Fatigue, Snoring Epworth Sleepiness Score: 17  SLEEP STUDY TECHNIQUE As per the AASM Manual for the Scoring of Sleep and Associated Events v2.3 (April 2016) with a hypopnea requiring 4% desaturations.  The channels recorded and monitored were frontal, central and occipital EEG, electrooculogram (EOG), submentalis EMG (chin), nasal and oral airflow, thoracic and abdominal wall motion, anterior tibialis EMG, snore microphone, electrocardiogram, and pulse oximetry. Continuous positive airway pressure (CPAP) was initiated when the patient met split night criteria and was titrated according to treat sleep-disordered breathing.  MEDICATIONS Medications self-administered by patient taken the night of the study : none reported  RESPIRATORY PARAMETERS Diagnostic  Total AHI (/hr): 27.1 RDI (/hr): 31.6 OA Index (/hr): 0.8 CA Index (/hr): 0.0 REM AHI (/hr): 62.5 NREM AHI (/hr): 20.3 Supine AHI (/hr): 27.1 Non-supine AHI (/hr): 0 Min O2 Sat (%): 78.0 Mean O2 (%): 92.8 Time below 88% (min): 8   Titration  Optimal Pressure (cm): 19 AHI at Optimal Pressure (/hr): 0.0 Min O2 at Optimal Pressure (%): 91.0 Supine % at Optimal (%): 100 Sleep % at Optimal (%): 93   SLEEP ARCHITECTURE The recording time for the entire night was 379.6 minutes.  During a baseline period of 169.5 minutes, the patient slept for 148.1 minutes in REM and nonREM, yielding a sleep efficiency of 87.4%%. Sleep onset after lights out was 6.4 minutes with a REM latency of 56.0 minutes. The patient spent 22.6%% of the  night in stage N1 sleep, 61.2%% in stage N2 sleep, 0.0%% in stage N3 and 16.2% in REM.  During the titration period of 202.2 minutes, the patient slept for 170.8 minutes in REM and nonREM, yielding a sleep efficiency of 84.4%%. Sleep onset after CPAP initiation was 10.5 minutes with a REM latency of 49.0 minutes. The patient spent 11.4%% of the night in stage N1 sleep, 58.7%% in stage N2 sleep, 0.0%% in stage N3 and 29.9% in REM.  CARDIAC DATA The 2 lead EKG demonstrated sinus rhythm. The mean heart rate was 100.0 beats per minute. Other EKG findings include: None.  LEG MOVEMENT DATA The total Periodic Limb Movements of Sleep (PLMS) were 0. The PLMS index was 0.0 .  IMPRESSIONS - Moderate obstructive sleep apnea occurred during the diagnostic portion of the study(AHI = 27.1/hour). An optimal PAP pressure was selected for this patient ( 19 cm of water) - No significant central sleep apnea occurred during the diagnostic portion of the study (CAI = 0.0/hour). - Moderate oxygen desaturation was noted during the diagnostic portion of the study (Min O2 =78.0%). - The patient snored with loud snoring volume during the diagnostic portion of the study. - No cardiac abnormalities were noted during this study. - Clinically significant periodic limb movements did not occur during sleep.  DIAGNOSIS - Obstructive Sleep Apnea (327.23 [G47.33 ICD-10])  RECOMMENDATIONS - Trial of CPAP therapy on 19 cm H2O or autopap 10-20. - Patient used a Large size Resmed Full Face Mask AirFit F20 mask and heated humidification. - Be careful with alcohol, sedatives and other CNS depressants that may worsen sleep apnea and disrupt normal sleep architecture. - Sleep hygiene should be reviewed to assess factors that may  improve sleep quality. - Weight management and regular exercise should be initiated or continued.  [Electronically signed] 12/19/2018 11:27 AM  Baird Lyons MD, ABSM Diplomate, American Board of Sleep  Medicine   NPI: [id number]                         [place] [place] [facility]  ELECTRONICALLY SIGNED ON:  12/19/2018, 11:08 AM [place] PH: [phone]   FX: [phone] [place]

## 2019-01-12 ENCOUNTER — Ambulatory Visit (INDEPENDENT_AMBULATORY_CARE_PROVIDER_SITE_OTHER): Payer: BC Managed Care – PPO | Admitting: Pulmonary Disease

## 2019-01-12 ENCOUNTER — Other Ambulatory Visit: Payer: Self-pay

## 2019-01-12 ENCOUNTER — Encounter: Payer: Self-pay | Admitting: Pulmonary Disease

## 2019-01-12 VITALS — BP 130/82 | HR 80 | Temp 97.3°F | Ht 72.0 in | Wt 305.8 lb

## 2019-01-12 DIAGNOSIS — G4733 Obstructive sleep apnea (adult) (pediatric): Secondary | ICD-10-CM | POA: Diagnosis not present

## 2019-01-12 NOTE — Progress Notes (Signed)
Manuel Melendez    [id number]    November 23, 1960  Primary Care Physician:Hall, Quillian Quince, MD (Inactive)  Referring Physician: No referring provider defined for this encounter.  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 cephALEXin (KEFLEX) 250 MG capsule Take 1 capsule (250 mg total) by mouth 4 (four) times daily.  Marland Kitchen dexlansoprazole (DEXILANT) 60 MG capsule Take 60 mg by mouth daily.  . methocarbamol (ROBAXIN) 500 MG tablet Take 1 tablet (500 mg total) by mouth 2 (two) times daily.  . naproxen (NAPROSYN) 500 MG tablet Take 1 tablet (500 mg total) by mouth 2 (two) times daily.  Marland Kitchen oxyCODONE-acetaminophen (PERCOCET) 10-325 MG per tablet Take 1 tablet by mouth every 4 (four) hours as needed for pain.  Marland Kitchen PRESCRIPTION MEDICATION Take 1 tablet by mouth daily. Combo blood pressure medication  . tamsulosin (FLOMAX) 0.4 MG CAPS capsule Take 0.4 mg by mouth daily.   No facility-administered encounter medications on file as of 01/12/2019.     Allergies as of 01/12/2019  . (No Known Allergies)    Past Medical History:  Diagnosis Date  . Arthritis   . Hypercholesteremia   . Hypertension     Past Surgical History:  Procedure Laterality Date  . HERNIA REPAIR    . SHOULDER SURGERY Bilateral     No family history on file.  Social History   Socioeconomic  History  . Marital status: Widowed    Spouse name: Not on file  . Number of children: Not on file  . Years of education: Not on file  . Highest education level: Not on file  Occupational History  . Not on file  Social Needs  . Financial resource strain: Not on file  . Food insecurity    Worry: Not on file    Inability: Not on file  . Transportation needs    Medical: Not on file    Non-medical: Not on file  Tobacco Use  . Smoking status: Never Smoker  . Smokeless tobacco: Never Used  Substance and Sexual Activity  . Alcohol use: No  . Drug use: No  . Sexual activity: Not on file  Lifestyle  . Physical activity    Days per week: Not on file    Minutes per session: Not on file  . Stress: Not on file  Relationships  . Social Herbalist on phone: Not on file    Gets together: Not on file    Attends religious service: Not on file    Active member of club or organization: Not on file    Attends meetings of clubs or organizations: Not on file    Relationship status: Not on file  . Intimate partner violence    Fear of current or ex partner: Not on file    Emotionally abused: Not  on file    Physically abused: Not on file    Forced sexual activity: Not on file  Other Topics Concern  . Not on file  Social History Narrative  . Not on file    Review of Systems  Constitutional: Positive for fatigue.  HENT: Negative.   Eyes: Negative.   Respiratory: Positive for apnea and shortness of breath.   Cardiovascular: Negative for chest pain.  Gastrointestinal: Negative.   Endocrine: Negative.   Psychiatric/Behavioral: Positive for sleep disturbance.    Vitals:   01/12/19 1406  BP: 130/82  Pulse: 80  Temp: (!) 97.3 F (36.3 C)  SpO2: 98%     Physical Exam  Constitutional: He appears well-developed and well-nourished.  HENT:  Head: Normocephalic and atraumatic.  Eyes: Right eye exhibits no discharge. Left eye exhibits no discharge.  Neck: Normal range of  motion. Neck supple. No tracheal deviation present. No thyromegaly present.  Cardiovascular: Normal rate, regular rhythm and normal heart sounds.  Pulmonary/Chest: Effort normal and breath sounds normal. No respiratory distress. He has no wheezes. He has no rales.  Abdominal: Soft. Bowel sounds are normal.  Musculoskeletal: Normal range of motion.        General: No deformity or edema.  Neurological: He is alert. No cranial nerve deficit.  Skin: Skin is warm and dry. No erythema.  Psychiatric: He has a normal mood and affect.    Data Reviewed: Sleep study from 12/12/2018 reviewed  Assessment:  Moderate obstructive sleep apnea adequately treated with CPAP of 19  Morbid obesity  Pathophysiology of sleep disordered breathing discussed with patient  Treatment options for sleep disordered breathing discussed with the patient   Plan/Recommendations: Referral to DME company  Start on CPAP at 19   I will see him back in the office in about 3 months  Encouraged to continue working on weight loss efforts  Virl DiamondAdewale Rachelanne Whidby MD [facility] 01/12/2019, 2:33 PM  CC: No ref. provider found

## 2019-01-12 NOTE — Patient Instructions (Signed)
Obstructive sleep apnea Recently titrated to CPAP of 19  Referral to DME company to start on CPAP  I will see you physically in the office in about 3 months to check on how the machine is working  Continue weight loss efforts  Call with significant concerns   Sleep Apnea Sleep apnea is a condition in which breathing pauses or becomes shallow during sleep. Episodes of sleep apnea usually last 10 seconds or longer, and they may occur as many as 20 times an hour. Sleep apnea disrupts your sleep and keeps your body from getting the rest that it needs. This condition can increase your risk of certain health problems, including:  Heart attack.  Stroke.  Obesity.  Diabetes.  Heart failure.  Irregular heartbeat. What are the causes? There are three kinds of sleep apnea:  Obstructive sleep apnea. This kind is caused by a blocked or collapsed airway.  Central sleep apnea. This kind happens when the part of the brain that controls breathing does not send the correct signals to the muscles that control breathing.  Mixed sleep apnea. This is a combination of obstructive and central sleep apnea. The most common cause of this condition is a collapsed or blocked airway. An airway can collapse or become blocked if:  Your throat muscles are abnormally relaxed.  Your tongue and tonsils are larger than normal.  You are overweight.  Your airway is smaller than normal. What increases the risk? You are more likely to develop this condition if you:  Are overweight.  Smoke.  Have a smaller than normal airway.  Are elderly.  Are male.  Drink alcohol.  Take sedatives or tranquilizers.  Have a family history of sleep apnea. What are the signs or symptoms? Symptoms of this condition include:  Trouble staying asleep.  Daytime sleepiness and tiredness.  Irritability.  Loud snoring.  Morning headaches.  Trouble concentrating.  Forgetfulness.  Decreased interest in sex.   Unexplained sleepiness.  Mood swings.  Personality changes.  Feelings of depression.  Waking up often during the night to urinate.  Dry mouth.  Sore throat. How is this diagnosed? This condition may be diagnosed with:  A medical history.  A physical exam.  A series of tests that are done while you are sleeping (sleep study). These tests are usually done in a sleep lab, but they may also be done at home. How is this treated? Treatment for this condition aims to restore normal breathing and to ease symptoms during sleep. It may involve managing health issues that can affect breathing, such as high blood pressure or obesity. Treatment may include:  Sleeping on your side.  Using a decongestant if you have nasal congestion.  Avoiding the use of depressants, including alcohol, sedatives, and narcotics.  Losing weight if you are overweight.  Making changes to your diet.  Quitting smoking.  Using a device to open your airway while you sleep, such as: ? An oral appliance. This is a custom-made mouthpiece that shifts your lower jaw forward. ? A continuous positive airway pressure (CPAP) device. This device blows air through a mask when you breathe out (exhale). ? A nasal expiratory positive airway pressure (EPAP) device. This device has valves that you put into each nostril. ? A bi-level positive airway pressure (BPAP) device. This device blows air through a mask when you breathe in (inhale) and breathe out (exhale).  Having surgery if other treatments do not work. During surgery, excess tissue is removed to create a wider airway.  It is important to get treatment for sleep apnea. Without treatment, this condition can lead to:  High blood pressure.  Coronary artery disease.  In men, an inability to achieve or maintain an erection (impotence).  Reduced thinking abilities. Follow these instructions at home: Lifestyle  Make any lifestyle changes that your health care  provider recommends.  Eat a healthy, well-balanced diet.  Take steps to lose weight if you are overweight.  Avoid using depressants, including alcohol, sedatives, and narcotics.  Do not use any products that contain nicotine or tobacco, such as cigarettes, e-cigarettes, and chewing tobacco. If you need help quitting, ask your health care provider. General instructions  Take over-the-counter and prescription medicines only as told by your health care provider.  If you were given a device to open your airway while you sleep, use it only as told by your health care provider.  If you are having surgery, make sure to tell your health care provider you have sleep apnea. You may need to bring your device with you.  Keep all follow-up visits as told by your health care provider. This is important. Contact a health care provider if:  The device that you received to open your airway during sleep is uncomfortable or does not seem to be working.  Your symptoms do not improve.  Your symptoms get worse. Get help right away if:  You develop: ? Chest pain. ? Shortness of breath. ? Discomfort in your back, arms, or stomach.  You have: ? Trouble speaking. ? Weakness on one side of your body. ? Drooping in your face. These symptoms may represent a serious problem that is an emergency. Do not wait to see if the symptoms will go away. Get medical help right away. Call your local emergency services (911 in the [place] Do not drive yourself to the hospital. Summary  Sleep apnea is a condition in which breathing pauses or becomes shallow during sleep.  The most common cause is a collapsed or blocked airway.  The goal of treatment is to restore normal breathing and to ease symptoms during sleep. This information is not intended to replace advice given to you by your health care provider. Make sure you discuss any questions you have with your health care provider. Document Released: 04/05/2002  Document Revised: 01/30/2018 Document Reviewed: 12/09/2017 Elsevier Patient Education  2020 [employer]

## 2019-08-24 ENCOUNTER — Ambulatory Visit: Payer: BC Managed Care – PPO | Admitting: Pulmonary Disease

## 2019-08-24 ENCOUNTER — Encounter: Payer: Self-pay | Admitting: Pulmonary Disease

## 2019-08-24 ENCOUNTER — Other Ambulatory Visit: Payer: Self-pay

## 2019-08-24 VITALS — BP 130/82 | HR 75 | Temp 97.8°F | Ht 72.0 in | Wt 297.8 lb

## 2019-08-24 DIAGNOSIS — G4733 Obstructive sleep apnea (adult) (pediatric): Secondary | ICD-10-CM | POA: Diagnosis not present

## 2019-08-24 NOTE — Progress Notes (Signed)
Manuel Melendez    [id number]    [birth year]  Primary Care Physician:Hall, Quillian Quince, MD (Inactive)  Referring Physician: No referring provider defined for this encounter.  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 cephALEXin (KEFLEX) 250 MG capsule Take 1 capsule (250 mg total) by mouth 4 (four) times daily.  Marland Kitchen dexlansoprazole (DEXILANT) 60 MG capsule Take 60 mg by mouth daily.  . methocarbamol (ROBAXIN) 500 MG tablet Take 1 tablet (500 mg total) by mouth 2 (two) times daily.  . naproxen (NAPROSYN) 500 MG tablet Take 1 tablet (500 mg total) by mouth 2 (two) times daily.  Marland Kitchen oxyCODONE-acetaminophen (PERCOCET) 10-325 MG per tablet Take 1 tablet by mouth every 4 (four) hours as needed for pain.  Marland Kitchen PRESCRIPTION MEDICATION Take 1 tablet by mouth daily. Combo blood pressure medication  . tamsulosin (FLOMAX) 0.4 MG CAPS capsule Take 0.4 mg by mouth daily.   No facility-administered encounter medications on file as of 08/24/2019.    Allergies as of 08/24/2019  . (No Known Allergies)    Past Medical History:  Diagnosis Date  . Arthritis   . Hypercholesteremia   . Hypertension     Past Surgical History:  Procedure Laterality Date  . HERNIA REPAIR    . SHOULDER SURGERY Bilateral     History reviewed. No pertinent family history.  Social History     Socioeconomic History  . Marital status: Widowed    Spouse name: Not on file  . Number of children: Not on file  . Years of education: Not on file  . Highest education level: Not on file  Occupational History  . Not on file  Tobacco Use  . Smoking status: Never Smoker  . Smokeless tobacco: Never Used  Substance and Sexual Activity  . Alcohol use: No  . Drug use: No  . Sexual activity: Not on file  Other Topics Concern  . Not on file  Social History Narrative  . Not on file   Social Determinants of Health   Financial Resource Strain:   . Difficulty of Paying Living Expenses:   Food Insecurity:   . Worried About Charity fundraiser in the Last Year:   . Arboriculturist in the Last Year:   Transportation Needs:   . Film/video editor (Medical):   Marland Kitchen Lack of Transportation (Non-Medical):   Physical Activity:   . Days of Exercise per Week:   . Minutes of Exercise per Session:   Stress:   . Feeling of Stress :   Social Connections:   . Frequency of Communication with Friends and Family:   . Frequency of Social Gatherings with Friends and Family:   . Attends Religious Services:   . Active Member of Clubs or Organizations:   . Attends Archivist Meetings:   .  Marital Status:   Intimate Partner Violence:   . Fear of Current or Ex-Partner:   . Emotionally Abused:   Marland Kitchen Physically Abused:   . Sexually Abused:     Review of Systems  Constitutional: Positive for fatigue.  HENT: Negative.   Eyes: Negative.   Respiratory: Positive for apnea and shortness of breath.   Cardiovascular: Negative for chest pain.  Gastrointestinal: Negative.   Endocrine: Negative.   Psychiatric/Behavioral: Positive for sleep disturbance.    Vitals:   08/24/19 1111  BP: 130/82  Pulse: 75  Temp: 97.8 F (36.6 C)  SpO2: 98%     Physical Exam  Constitutional: He appears well-developed and well-nourished.  HENT:  Head: Normocephalic and atraumatic.  Eyes: Right eye  exhibits no discharge. Left eye exhibits no discharge.  Neck: No tracheal deviation present. No thyromegaly present.  Cardiovascular: Normal rate, regular rhythm and normal heart sounds.  Pulmonary/Chest: Effort normal and breath sounds normal. No respiratory distress. He has no wheezes. He has no rales.  Abdominal: Soft. Bowel sounds are normal.  Musculoskeletal:        General: No deformity or edema. Normal range of motion.     Cervical back: Normal range of motion and neck supple.  Neurological: He is alert. No cranial nerve deficit.  Skin: Skin is warm and dry. No erythema.  Psychiatric: He has a normal mood and affect.    Data Reviewed: Sleep study from 12/12/2018 reviewed  Assessment:  Moderate obstructive sleep apnea adequately treated with CPAP of 19  Morbid obesity  Pathophysiology of sleep disordered breathing discussed with patient  Treatment options for sleep disordered breathing discussed with the patient   Plan/Recommendations: Referral to DME company  Start on CPAP at 19   I will see him back in the office in about 3 months  Encouraged to continue working on weight loss efforts  Virl Diamond MD [facility] 08/24/2019, 11:35 AM  CC: No ref. provider found

## 2019-08-24 NOTE — Patient Instructions (Signed)
CPAP supplies to DME company Download in 4 weeks  I will see you back in 4 to 6 weeks  Ensure you use the machine on a nightly basis, call us if you have any concerns or the if the pressure is not tolerated

## 2019-08-29 ENCOUNTER — Encounter: Payer: Self-pay | Admitting: Pulmonary Disease

## 2019-11-23 ENCOUNTER — Telehealth: Payer: Self-pay | Admitting: Pulmonary Disease

## 2019-11-23 DIAGNOSIS — G4733 Obstructive sleep apnea (adult) (pediatric): Secondary | ICD-10-CM

## 2019-11-23 NOTE — Telephone Encounter (Signed)
Attempted to call patient at all numbers listed, unable to leave a message. Urgent DME order placed for patient to get needed supplies. Unable to leave patient a message or reach any family members.

## 2019-11-25 NOTE — Telephone Encounter (Signed)
ATC pt, there was no answer and his VM is not set up. Will try back. 

## 2019-11-25 NOTE — Progress Notes (Signed)
@[email or web address] Melendez  ID: , male    DOB: 26-Feb-1961, 59 y.o.   MRN: [id number]  Chief Complaint  Manuel Melendez presents with  . Follow-up    osa    Referring provider: [id number], MD  HPI: 59 year old male, never smoked. PMH significant for OSA, hypertension. Manuel Melendez of Dr. 46, last seen on 08/24/19. Maintained on CPAP at 19cm h20.    11/26/2019 Manuel Melendez presents today for 3 month follow-up. He reports compliance with CPAP but states that he hasn't used it the last 4-5 days because his needs a new mask. He has not been sleeping well without CPAP. Appeared tired today. States that he hasn't received new supplies in 1 year. He uses a full face mask. Reports that he is experiencing air leak from mask. DME company is [facility].    No Known Allergies  Immunization History  Administered Date(s) Administered  . Influenza,inj,Quad PF,6-35 Mos 02/28/2019  . Tdap 09/28/2013    Past Medical History:  Diagnosis Date  . Arthritis   . Hypercholesteremia   . Hypertension     Tobacco History: Social History   Tobacco Use  Smoking Status Never Smoker  Smokeless Tobacco Never Used   Counseling given: Not Answered   Outpatient Medications Prior to Visit  Medication Sig Dispense Refill  . amLODipine-benazepril (LOTREL) 10-20 MG capsule Take 1 capsule by mouth daily.    . carvedilol (COREG) 6.25 MG tablet Take 6.25 mg by mouth 2 (two) times daily.    . cephALEXin (KEFLEX) 250 MG capsule Take 1 capsule (250 mg total) by mouth 4 (four) times daily. 28 capsule 0  . chlorthalidone (HYGROTON) 25 MG tablet Take 25 mg by mouth daily.    . clotrimazole-betamethasone (LOTRISONE) cream SMARTSIG:1 Topical Every Night    . dexlansoprazole (DEXILANT) 60 MG capsule Take 60 mg by mouth daily.    . fluticasone (FLONASE) 50 MCG/ACT nasal spray Place into both nostrils.    11/28/2013 ibuprofen (ADVIL) 800 MG tablet Take 800 mg by mouth 3 (three) times daily.    . methocarbamol (ROBAXIN) 500 MG  tablet Take 1 tablet (500 mg total) by mouth 2 (two) times daily. 20 tablet 0  . naproxen (NAPROSYN) 500 MG tablet Take 1 tablet (500 mg total) by mouth 2 (two) times daily. 30 tablet 0  . oxyCODONE-acetaminophen (PERCOCET) 10-325 MG per tablet Take 1 tablet by mouth every 4 (four) hours as needed for pain.    Marland Kitchen PRESCRIPTION MEDICATION Take 1 tablet by mouth daily. Combo blood pressure medication    . tamsulosin (FLOMAX) 0.4 MG CAPS capsule Take 0.4 mg by mouth daily.     No facility-administered medications prior to visit.   Review of Systems  Review of Systems  Constitutional: Positive for fatigue.  Respiratory: Negative.   Psychiatric/Behavioral: Positive for sleep disturbance.   Physical Exam  BP (!) 130/70 (BP Location: Left Arm, Cuff Size: Normal)   Pulse 78   Temp (!) 97.3 F (36.3 C) (Oral)   Ht 6' (1.829 m)   Wt (!) 308 lb 3.2 oz (139.8 kg)   SpO2 97%   BMI 41.80 kg/m  Physical Exam Constitutional:      Appearance: Normal appearance.  HENT:     Head: Normocephalic and atraumatic.  Cardiovascular:     Rate and Rhythm: Normal rate.  Pulmonary:     Effort: Pulmonary effort is normal.  Skin:    General: Skin is warm and dry.  Neurological:     General: No  focal deficit present.     Mental Status: He is alert and oriented to person, place, and time. Mental status is at baseline.  Psychiatric:        Mood and Affect: Mood normal.        Behavior: Behavior normal.      Lab Results:  CBC No results found for: WBC, RBC, HGB, HCT, PLT, MCV, MCH, MCHC, RDW, LYMPHSABS, MONOABS, EOSABS, BASOSABS  BMET No results found for: NA, K, CL, CO2, GLUCOSE, BUN, CREATININE, CALCIUM, GFRNONAA, GFRAA  BNP No results found for: BNP  ProBNP No results found for: PROBNP  Imaging: No results found.   Assessment & Plan:   Obstructive sleep apnea - Manuel Melendez reports compliance and benefit from CPAP, airview not available. He  - Renew CPAP 19cm h20, supplies, mask of  choice, humidification, enroll in [place]  - Advised Manuel Melendez to continue using CPAP every night for minimum of 4-6 hours. Do not drive if experiencing excessive daytime fatigue or somnolence - Follow-up: 3 months with Dr. Daleen Squibb, NP 12/20/2019

## 2019-11-25 NOTE — Telephone Encounter (Signed)
Spoke with the pt  He states in need of new CPAP mask  Order placed for this already on 7/27 and according to referral notes received by [employer] today 11/25/19  Nothing further needed

## 2019-11-26 ENCOUNTER — Other Ambulatory Visit: Payer: Self-pay

## 2019-11-26 ENCOUNTER — Ambulatory Visit (INDEPENDENT_AMBULATORY_CARE_PROVIDER_SITE_OTHER): Payer: BC Managed Care – PPO | Admitting: Primary Care

## 2019-11-26 ENCOUNTER — Encounter: Payer: Self-pay | Admitting: Primary Care

## 2019-11-26 VITALS — BP 130/70 | HR 78 | Temp 97.3°F | Ht 72.0 in | Wt 308.2 lb

## 2019-11-26 DIAGNOSIS — G4733 Obstructive sleep apnea (adult) (pediatric): Secondary | ICD-10-CM | POA: Diagnosis not present

## 2019-11-26 NOTE — Patient Instructions (Addendum)
Orders: Please place order renew CPAP 19cm h20, supplies, mask of choice, humidification, enroll in airview   Recommendations: Use CPAP every night for minimum of 4-6 hours  Do not drive if experiencing excessive daytime fatigue or somnolence  Follow-up: 3 months with [name]

## 2019-12-14 ENCOUNTER — Telehealth: Payer: Self-pay | Admitting: Pulmonary Disease

## 2019-12-14 NOTE — Telephone Encounter (Signed)
Please call to check on the status of the order for CPAP.

## 2019-12-15 NOTE — Telephone Encounter (Signed)
Response from [employer]:  Kathyrn Sheriff sent to Benetta Spar, Fabian Sharp; Dellia Beckwith,  Sorry for the delay, I was gathering info. Per our processing team we contacted the client but the order was canceled for supplies due to account issues. The pressure setting were completed.  Patient will need to satisfy account issues before obtaining new items. Patient is aware per processing team.  Thank you    I attempted to reach the patient-no answer-will try to reach the pt again tomorrow.

## 2019-12-15 NOTE — Telephone Encounter (Signed)
F/U CM sent to [employer] to request an update on this order.  It was sent & received by [employer] on 11/29/19.

## 2019-12-20 NOTE — Assessment & Plan Note (Signed)
-   Patient reports compliance and benefit from CPAP, airview not available. He  - Renew CPAP 19cm h20, supplies, mask of choice, humidification, enroll in [place]  - Advised patient to continue using CPAP every night for minimum of 4-6 hours. Do not drive if experiencing excessive daytime fatigue or somnolence - Follow-up: 3 months with [name]

## 2019-12-23 NOTE — Telephone Encounter (Signed)
ATC pt. VM box has not been set up yet. WCB.  

## 2019-12-28 NOTE — Telephone Encounter (Signed)
Called and spoke to pt. Informed him of the message per [employer]. Pt states he contacted insurance and was advised that pt should not have a bill. Pt states [employer] never sent him a formal itemized bill and only spoke over the phone. The pt states he didn't feel comfortable paying anything without seeing a bill from [employer].   Pt also states he has one of the CPAP machines that was recalled. Pt does not want to wear this as it was recalled for health concerns. Pt has not worn his CPAP in weeks. Pt states he spoke to [employer] about this and was advised to contact the manufacturer. Pt did this and was hung up on several times. Pt aware that if he were to d/c the CPAP then he would have to turn the machine in and then if he were to start CPAP again he would need to do another sleep study then be re-issued another CPAP.    Called and spoke to [facility] with [employer] and was advised that the pt has a $30.16 bill. She also stated that the pt would have to register his CPAP on [employer] website and then wait to see what [employer] can do (I.e. issue a new CPAP). Pt was given [employer]'s billing number [phone].   [name], please advise if you think pt should stop his CPAP as it is one of the recalled machines. Pt requesting to change DMEs as well. Please advise if you have any additional recs for pt's CPAP issues. Thanks.

## 2019-12-28 NOTE — Telephone Encounter (Signed)
My suggestion would be to adhere to recommendations by [employer] regarding having the machine repaired/serviced/replaced  I do not think he should stop his CPAP as he was moderately severe sleep apnea at diagnosis This has to be balanced with the risks associated with the recall and the risks of not treating sleep disordered breathing  Unfortunately, this is beyond what we can control

## 2019-12-29 NOTE — Telephone Encounter (Signed)
ATC patient about Dr. Trena Platt rec unable to leave message due to VM not being set up

## 2019-12-29 NOTE — Telephone Encounter (Signed)
Patient called back and we went over recs from [name] and he states " So I am just stuck here with nothing"  He states that he has a machine but was sent the wrong supplies and has not been able to use machine in at least a month. I asked patient if he has reached out to [employer] to see if they can get him right supplies. Patient states that he has been trying for the last 3 months.  Informed patient that I would reach out to [employer] and see what we could find out. Community message sent to [place]. Will wait for their response. If they can not help the patient he would like to switch DME's

## 2020-01-06 NOTE — Telephone Encounter (Addendum)
ATC pt. VM box has not been set up yet. WCB.    New, [name], [name], CMA; New, [employer], Here is the reply I got back just ot keep you in the loop:   Manuel Melendez,   From what I can tell, Manuel Melendez had a RX from 07.27.21 and 07.30.21. The RX from 07.30.21 was processed as a supply order and a pressure change and both scripts were sent to [facility] and voided in BT.  Manuel Melendez was contacted by [facility] and cancelled his order due to a large balance due. The last F2F notes in his account are from April, 2021 and mention he needed supplies. The patient has a RESP DS from 09.28.20 and is not eligible for a replacement due to the recall.   I have ask them to contact the client and update him on this info and to see how we can assist him further.  Thank you,   Luellen Pucker

## 2020-01-08 NOTE — Telephone Encounter (Signed)
Spoke with the pt  He states that he has still not been contacted by [employer] at all  Will send another community  msg to [employer]

## 2020-01-13 ENCOUNTER — Telehealth: Payer: Self-pay | Admitting: Pulmonary Disease

## 2020-01-13 NOTE — Telephone Encounter (Signed)
Spoke with the pt's PCP and notified that we did order the pt's cpap supplies and due to balance with [employer] they will not fulfill his order. Nothing further needed.

## 2020-01-13 NOTE — Telephone Encounter (Signed)
  Because there isnt any additional information that will help the pt I am checking with [employer] to see if the pt is able to leave [employer] to change to another DME. Will await response.     Hello,  Just ot provide a little more detail. The patient canceled due to his out of packet amount due. We called and verified with the insurance. Patient disagreed with the insurance not paying in full and canceled order.   I am just keeping you in the loop. We did call and verify this info in detail. Sorry we are unable to assist him at this time.  Thank you,  Nida Boatman

## 2020-01-18 NOTE — Telephone Encounter (Signed)
I do not have a return message. LMTCB for Melissa with [employer].   Message has been closed will forward to triage to follow.

## 2020-01-19 NOTE — Telephone Encounter (Signed)
I spoke with Manuel Melendez  She states that there is no reason order can not be sent to another DME  Will sent to [facility] pool to let them know this needs to be done  Thanks

## 2020-05-11 ENCOUNTER — Other Ambulatory Visit: Payer: Self-pay

## 2020-05-11 DIAGNOSIS — Z20822 Contact with and (suspected) exposure to covid-19: Secondary | ICD-10-CM

## 2020-05-13 LAB — SARS-COV-2, NAA 2 DAY TAT

## 2020-05-13 LAB — NOVEL CORONAVIRUS, NAA: SARS-CoV-2, NAA: NOT DETECTED

## 2020-07-07 ENCOUNTER — Emergency Department (HOSPITAL_COMMUNITY): Payer: BC Managed Care – PPO

## 2020-07-07 ENCOUNTER — Encounter (HOSPITAL_COMMUNITY): Payer: Self-pay

## 2020-07-07 ENCOUNTER — Emergency Department (HOSPITAL_COMMUNITY)
Admission: EM | Admit: 2020-07-07 | Discharge: 2020-07-07 | Disposition: A | Discharge status: Home / Self Care | Payer: BC Managed Care – PPO | Attending: Emergency Medicine | Admitting: Emergency Medicine

## 2020-07-07 DIAGNOSIS — M79671 Pain in right foot: Secondary | ICD-10-CM | POA: Insufficient documentation

## 2020-07-07 MED ORDER — DICLOFENAC SODIUM 1 % EX GEL: 2.0000 g | Freq: Four times a day (QID) | CUTANEOUS | Status: DC

## 2020-07-07 MED ORDER — KETOROLAC TROMETHAMINE 30 MG/ML IJ SOLN
15.0000 mg | Freq: Once | INTRAMUSCULAR | Status: AC
  Administered 2020-07-07: 15 mg via INTRAMUSCULAR
  Filled 2020-07-07: qty 1

## 2020-07-07 MED ORDER — PREDNISONE 20 MG PO TABS: 40.0000 mg | ORAL_TABLET | Freq: Every day | ORAL | 0 refills | Status: DC

## 2020-07-07 MED ORDER — DICLOFENAC SODIUM 1 % EX GEL
2.0000 g | Freq: Once | CUTANEOUS | Status: AC
  Administered 2020-07-07: 2 g via TOPICAL
  Filled 2020-07-07: qty 100

## 2020-07-07 NOTE — ED Provider Notes (Signed)
[facility] EMERGENCY DEPARTMENT Provider Note   CSN: [id number] Arrival date & time: 07/07/20  1435     History Chief Complaint  Patient presents with  . Foot Pain    Manuel Melendez is a 60 y.o. male.  HPI Patient presents with pain focally in the right lateral foot.  Onset is atraumatic, since that time has been persistent.  This began about 4 days ago.  He had 1 prior similar episode about a year ago, received a an injection.  He does not feel as though that improved his condition, but his symptoms did improve until 4 days ago.  He has no new footwear until today, no new job activity, no fall.  No other complaints including fever, nausea, vomiting, no distal loss of sensation or weakness.  No relief with OTC medication.    History reviewed. No pertinent past medical history.  There are no problems to display for this patient.   History reviewed. No pertinent surgical history.     No family history on file.     Home Medications Prior to Admission medications   Medication Sig Start Date End Date Taking? Authorizing Provider  predniSONE (DELTASONE) 20 MG tablet Take 2 tablets (40 mg total) by mouth daily with breakfast. For the next four days 07/07/20  Yes Gerhard Munch, MD    Allergies    Patient has no allergy information on record.  Review of Systems   Review of Systems  Constitutional:       Per HPI, otherwise negative  HENT:       Per HPI, otherwise negative  Respiratory:       Per HPI, otherwise negative  Cardiovascular:       Per HPI, otherwise negative  Gastrointestinal: Negative for vomiting.  Endocrine:       Negative aside from HPI  Genitourinary:       Neg aside from HPI   Musculoskeletal:       Per HPI, otherwise negative  Skin: Negative.   Neurological: Negative for syncope.    Physical Exam Updated Vital Signs BP (!) 132/92 (BP Location: Right Arm)   Pulse 91   Temp 98.6 F (37 C)   Resp 20   SpO2 98%    Physical Exam Vitals and nursing note reviewed.  Constitutional:      General: He is not in acute distress.    Appearance: He is well-developed.  HENT:     Head: Normocephalic and atraumatic.  Eyes:     Conjunctiva/sclera: Conjunctivae normal.  Cardiovascular:     Rate and Rhythm: Normal rate and regular rhythm.     Pulses: Normal pulses.  Pulmonary:     Effort: Pulmonary effort is normal. No respiratory distress.     Breath sounds: No stridor.  Abdominal:     General: There is no distension.  Musculoskeletal:       Legs:  Skin:    General: Skin is warm and dry.  Neurological:     Mental Status: He is alert and oriented to person, place, and time.  Psychiatric:        Mood and Affect: Mood normal.        Behavior: Behavior normal.     ED Results / Procedures / Treatments   Radiology DG Foot Complete Right  Result Date: 07/07/2020 CLINICAL DATA:  Pain and swelling. EXAM: RIGHT FOOT COMPLETE - 3+ VIEW COMPARISON:  None. FINDINGS: The joint spaces are maintained. No acute fracture or bone  lesion. Moderate pes cavus is noted. Advanced vascular calcifications likely related to diabetes. IMPRESSION: 1. No acute bony findings or significant degenerative changes. 2. Advanced vascular calcifications. Electronically Signed   By: Rudie Meyer M.D.   On: 07/07/2020 15:33    Procedures Procedures   Medications Ordered in ED Medications  diclofenac Sodium (VOLTAREN) 1 % topical gel 2 g (has no administration in time range)  ketorolac (TORADOL) 30 MG/ML injection 15 mg (15 mg Intramuscular Given 07/07/20 1605)  diclofenac Sodium (VOLTAREN) 1 % topical gel 2 g (2 g Topical Given 07/07/20 1608)    ED Course  I have reviewed the triage vital signs and the nursing notes.  Pertinent labs & imaging results that were available during my care of the patient were reviewed by me and considered in my medical decision making (see chart for details).  On repeat exam after I reviewed the  patient's x-ray, I discussed it with him. No evidence for acute fracture, no evidence for Jones, pseudo-Jones.  Patient's pain is slightly more proximal to this area and superior as well. Some suspicion for soft tissue injury versus contusion. Patient is distally neurovascular intact, has no evidence for septic arthritis or other red flags.  Patient started on appropriate analgesics, received topical and oral medication was discharged with orthopedics follow-up. Final Clinical Impression(s) / ED Diagnoses Final diagnoses:  Foot pain, right    Rx / DC Orders ED Discharge Orders         Ordered    predniSONE (DELTASONE) 20 MG tablet  Daily with breakfast        07/07/20 1609           Gerhard Munch, MD 07/07/20 1644

## 2020-07-07 NOTE — Discharge Instructions (Signed)
As discussed, you have been diagnosed with right foot pain.  This is likely due to inflammation of the soft tissue around the lateral or side of your foot.  There is currently no evidence for infection, and this is not likely a case of gout.  However, if you do not improve, or develop new or concerning changes in your condition, do not hesitate to return here or follow-up with our orthopedic colleagues.

## 2020-07-07 NOTE — ED Triage Notes (Signed)
Pt reports right foot pain for the past 4 days, denies injury/truama to foot. States this happened last year and his doctor just gave him a shot and didn't tell him what was wrong. Pt ambulatory.

## 2020-07-24 ENCOUNTER — Telehealth (HOSPITAL_COMMUNITY): Payer: Self-pay | Admitting: Urgent Care

## 2020-07-24 ENCOUNTER — Encounter (HOSPITAL_COMMUNITY): Payer: Self-pay | Admitting: Urgent Care

## 2020-07-24 ENCOUNTER — Other Ambulatory Visit: Payer: Self-pay

## 2020-07-24 ENCOUNTER — Ambulatory Visit (HOSPITAL_COMMUNITY)
Admission: EM | Admit: 2020-07-24 | Discharge: 2020-07-24 | Disposition: A | Discharge status: Home / Self Care | Payer: BC Managed Care – PPO | Attending: Urgent Care | Admitting: Urgent Care

## 2020-07-24 DIAGNOSIS — R58 Hemorrhage, not elsewhere classified: Secondary | ICD-10-CM

## 2020-07-24 DIAGNOSIS — I808 Phlebitis and thrombophlebitis of other sites: Secondary | ICD-10-CM

## 2020-07-24 DIAGNOSIS — M79632 Pain in left forearm: Secondary | ICD-10-CM | POA: Diagnosis not present

## 2020-07-24 LAB — CBC WITH DIFFERENTIAL/PLATELET
Abs Immature Granulocytes: 0.05 10*3/uL (ref 0.00–0.07)
Basophils Absolute: 0 10*3/uL (ref 0.0–0.1)
Basophils Relative: 0 %
Eosinophils Absolute: 0.6 10*3/uL — ABNORMAL HIGH (ref 0.0–0.5)
Eosinophils Relative: 5 %
HCT: 44.7 % (ref 39.0–52.0)
Hemoglobin: 14.8 g/dL (ref 13.0–17.0)
Immature Granulocytes: 0 %
Lymphocytes Relative: 18 %
Lymphs Abs: 2.3 10*3/uL (ref 0.7–4.0)
MCH: 30.4 pg (ref 26.0–34.0)
MCHC: 33.1 g/dL (ref 30.0–36.0)
MCV: 91.8 fL (ref 80.0–100.0)
Monocytes Absolute: 0.9 10*3/uL (ref 0.1–1.0)
Monocytes Relative: 7 %
Neutro Abs: 8.6 10*3/uL — ABNORMAL HIGH (ref 1.7–7.7)
Neutrophils Relative %: 70 %
Platelets: 240 10*3/uL (ref 150–400)
RBC: 4.87 MIL/uL (ref 4.22–5.81)
RDW: 14 % (ref 11.5–15.5)
WBC: 12.5 10*3/uL — ABNORMAL HIGH (ref 4.0–10.5)
nRBC: 0 % (ref 0.0–0.2)

## 2020-07-24 MED ORDER — NAPROXEN 375 MG PO TABS
375.0000 mg | ORAL_TABLET | Freq: Two times a day (BID) | ORAL | 0 refills | Status: DC
Start: 2020-07-24 — End: 2024-02-19

## 2020-07-24 MED ORDER — CEPHALEXIN 500 MG PO CAPS
500.0000 mg | ORAL_CAPSULE | Freq: Three times a day (TID) | ORAL | 0 refills | Status: DC
Start: 2020-07-24 — End: 2021-04-06

## 2020-07-24 NOTE — ED Triage Notes (Signed)
Pt presents with left arm pain, swelling, and bruising X 5 days with no known injury.

## 2020-07-24 NOTE — Telephone Encounter (Signed)
Due to leukocytosis and possibility now cellulitis, will use oral Keflex 3 times daily.  Called patient and discussed results with him, prescription being sent to his pharmacy now.

## 2020-07-24 NOTE — ED Provider Notes (Signed)
[facility]   MRN: [id number] DOB: [birth year]  Subjective:   Manuel Melendez is a 60 y.o. male presenting for 5-day history of persistent left forearm pain, swelling, noticed bruising in the past couple of days.  Denies any particular trauma, fall, inciting event.  Denies fever, chest pain, shortness of breath, heart racing, history of heart disease, history of MI.  No history of diabetes.  Patient is not a smoker.  He became concerned because he looked up online that he could be having a heart attack.  He does have a remote history of a right lower leg DVT.  States that this was treated for a very short time and has not had a recurrence.  He does have chronic pain from multiple joints and surgeries.  Takes oxycodone with Tylenol on a regular basis.  No current facility-administered medications for this encounter.  Current Outpatient Medications:  .  amLODipine-benazepril (LOTREL) 10-20 MG capsule, Take 1 capsule by mouth daily., Disp: , Rfl:  .  carvedilol (COREG) 6.25 MG tablet, Take 6.25 mg by mouth 2 (two) times daily., Disp: , Rfl:  .  chlorthalidone (HYGROTON) 25 MG tablet, Take 25 mg by mouth daily., Disp: , Rfl:  .  clotrimazole-betamethasone (LOTRISONE) cream, SMARTSIG:1 Topical Every Night, Disp: , Rfl:  .  dexlansoprazole (DEXILANT) 60 MG capsule, Take 60 mg by mouth daily., Disp: , Rfl:  .  fluticasone (FLONASE) 50 MCG/ACT nasal spray, Place into both nostrils., Disp: , Rfl:  .  ibuprofen (ADVIL) 800 MG tablet, Take 800 mg by mouth 3 (three) times daily., Disp: , Rfl:  .  methocarbamol (ROBAXIN) 500 MG tablet, Take 1 tablet (500 mg total) by mouth 2 (two) times daily., Disp: 20 tablet, Rfl: 0 .  naproxen (NAPROSYN) 500 MG tablet, Take 1 tablet (500 mg total) by mouth 2 (two) times daily., Disp: 30 tablet, Rfl: 0 .  oxyCODONE-acetaminophen (PERCOCET) 10-325 MG per tablet, Take 1 tablet by mouth every 4 (four) hours as needed for pain., Disp: , Rfl:  .  PRESCRIPTION  MEDICATION, Take 1 tablet by mouth daily. Combo blood pressure medication, Disp: , Rfl:  .  tamsulosin (FLOMAX) 0.4 MG CAPS capsule, Take 0.4 mg by mouth daily., Disp: , Rfl:    No Known Allergies  Past Medical History:  Diagnosis Date  . Arthritis   . Hypercholesteremia   . Hypertension      Past Surgical History:  Procedure Laterality Date  . HERNIA REPAIR    . SHOULDER SURGERY Bilateral     History reviewed. No pertinent family history.  Social History   Tobacco Use  . Smoking status: Never Smoker  . Smokeless tobacco: Never Used  Substance Use Topics  . Alcohol use: No  . Drug use: No    ROS   Objective:   Vitals: BP (!) 144/77 (BP Location: Right Arm)   Pulse 60   Temp 97.7 F (36.5 C) (Oral)   Resp 18   SpO2 97%   Physical Exam Constitutional:      General: He is not in acute distress.    Appearance: Normal appearance. He is well-developed. He is obese. He is not ill-appearing, toxic-appearing or diaphoretic.  HENT:     Head: Normocephalic and atraumatic.     Right Ear: External ear normal.     Left Ear: External ear normal.     Nose: Nose normal.     Mouth/Throat:     Mouth: Mucous membranes are moist.  Pharynx: Oropharynx is clear.  Eyes:     General: No scleral icterus.       Right eye: No discharge.        Left eye: No discharge.     Extraocular Movements: Extraocular movements intact.     Pupils: Pupils are equal, round, and reactive to light.  Neck:     Vascular: No carotid bruit.  Cardiovascular:     Rate and Rhythm: Normal rate and regular rhythm.     Heart sounds: Normal heart sounds. No murmur heard. No friction rub. No gallop.   Pulmonary:     Effort: Pulmonary effort is normal. No respiratory distress.     Breath sounds: Normal breath sounds. No stridor. No wheezing, rhonchi or rales.  Musculoskeletal:       Arms:     Cervical back: Normal range of motion.  Neurological:     Mental Status: He is alert and oriented to  person, place, and time.     Motor: No weakness.     Coordination: Coordination normal.     Gait: Gait normal.     Deep Tendon Reflexes: Reflexes normal.  Psychiatric:        Mood and Affect: Mood normal.        Behavior: Behavior normal.        Thought Content: Thought content normal.        Judgment: Judgment normal.     Assessment and Plan :   PDMP not reviewed this encounter.  1. Superficial thrombophlebitis of left upper extremity   2. Left forearm pain   3. Ecchymosis     Case discussed with Dr. Leonides Grills. Will manage for superficial thrombophlebitis given physical exam findings.  Deferred imaging given lack of trauma, musculoskeletal physical exam findings warranting this.  Reassured patient that his heart score is low (2 for HTN, HL, age) given lack of smoking, lack of diabetes also does not have symptoms or physical exam findings consistent with MI. Emphasized need for close follow up with his PCP. Counseled patient on potential for adverse effects with medications prescribed/recommended today, ER and return-to-clinic precautions discussed, patient verbalized understanding.    Wallis Bamberg, PA-C 07/24/20 1101

## 2020-08-31 ENCOUNTER — Encounter (HOSPITAL_COMMUNITY): Payer: Self-pay

## 2020-08-31 ENCOUNTER — Other Ambulatory Visit: Payer: Self-pay

## 2020-08-31 ENCOUNTER — Ambulatory Visit (HOSPITAL_COMMUNITY)
Admission: EM | Admit: 2020-08-31 | Discharge: 2020-08-31 | Disposition: A | Discharge status: Home / Self Care | Payer: BC Managed Care – PPO | Attending: Internal Medicine | Admitting: Internal Medicine

## 2020-08-31 DIAGNOSIS — M25811 Other specified joint disorders, right shoulder: Secondary | ICD-10-CM | POA: Diagnosis not present

## 2020-08-31 DIAGNOSIS — M25819 Other specified joint disorders, unspecified shoulder: Secondary | ICD-10-CM

## 2020-08-31 HISTORY — DX: Other seasonal allergic rhinitis: J30.2

## 2020-08-31 NOTE — Discharge Instructions (Signed)
Please follow up with your orthopedic surgeon if the cyst gets bigger or becomes painful.

## 2020-08-31 NOTE — ED Triage Notes (Signed)
Pt presents with "weird feeling" in right shoulder since 2 days ago. Bony protuberance noted to top of right shoulder, appearing as though arm has dislocated but pt states doesn't remember feeling shoulder coming out of joint. Pt able to move extremity and radial pulse 2+.  Pt reports having had both rotator cuffs repaired in the past.

## 2020-09-03 NOTE — ED Provider Notes (Signed)
[facility]    CSN: [id number] Arrival date & time: 08/31/20  1108      History   Chief Complaint Chief Complaint  Patient presents with  . right shoulder pain    HPI Manuel Melendez is a 60 y.o. male comes to urgent care with swelling noted to the right shoulder.  Patient says this has been present for at least a few weeks now.  It is not painful and he denies any trauma to the shoulder or falls.  No fever or chills.  Patient had bilateral rotator cuff surgery several years ago.  No history of gout.  No numbness or tingling in the hands. HPI  Past Medical History:  Diagnosis Date  . Arthritis   . Hypercholesteremia   . Hypertension   . Seasonal allergies     Patient Active Problem List   Diagnosis Date Noted  . Obstructive sleep apnea 01/12/2019  . Snoring 12/12/2018    Past Surgical History:  Procedure Laterality Date  . ANKLE SURGERY     per pt report  . HAND SURGERY     per pt report  . HERNIA REPAIR    . KNEE SURGERY     per pt report  . SHOULDER SURGERY Bilateral        Home Medications    Prior to Admission medications   Medication Sig Start Date End Date Taking? Authorizing Provider  amLODipine-benazepril (LOTREL) 10-20 MG capsule Take 1 capsule by mouth daily. 11/16/19   [provider]  carvedilol (COREG) 6.25 MG tablet Take 6.25 mg by mouth 2 (two) times daily. 11/16/19   [provider]  cephALEXin (KEFLEX) 500 MG capsule Take 1 capsule (500 mg total) by mouth 3 (three) times daily. 07/24/20   Wallis Bamberg, PA-C  chlorthalidone (HYGROTON) 25 MG tablet Take 25 mg by mouth daily. 11/16/19   [provider]  clotrimazole-betamethasone (LOTRISONE) cream SMARTSIG:1 Topical Every Night 11/16/19   [provider]  dexlansoprazole (DEXILANT) 60 MG capsule Take 60 mg by mouth daily.    [provider]  fluticasone (FLONASE) 50 MCG/ACT nasal spray Place into both nostrils. 11/16/19   [provider]  ibuprofen (ADVIL) 800 MG tablet Take 800 mg by mouth 3 (three) times daily. 11/22/19   [provider]  methocarbamol (ROBAXIN) 500 MG tablet Take 1 tablet (500 mg total) by mouth 2 (two) times daily. 06/14/16   Liberty Handy, PA-C  naproxen (NAPROSYN) 375 MG tablet Take 1 tablet (375 mg total) by mouth 2 (two) times daily with a meal. 07/24/20   Wallis Bamberg, PA-C  oxyCODONE-acetaminophen (PERCOCET) 10-325 MG per tablet Take 1 tablet by mouth every 4 (four) hours as needed for pain.    [provider]  predniSONE (DELTASONE) 20 MG tablet Take 2 tablets (40 mg total) by mouth daily with breakfast. For the next four days 07/07/20   Gerhard Munch, MD  PRESCRIPTION MEDICATION Take 1 tablet by mouth daily. Combo blood pressure medication    [provider]  tamsulosin (FLOMAX) 0.4 MG CAPS capsule Take 0.4 mg by mouth daily.    [provider]    Family History Family History  Family history unknown: Yes    Social History Social History   Tobacco Use  . Smoking status: Never Smoker  . Smokeless tobacco: Never Used  Substance Use Topics  . Alcohol use: No  . Drug use: No     Allergies   Other   Review  of Systems Review of Systems  HENT: Negative.   Musculoskeletal: Negative for arthralgias, back pain, gait problem, neck pain and neck stiffness.  Neurological: Negative.      Physical Exam Triage Vital Signs ED Triage Vitals  Enc Vitals Group     BP 08/31/20 1222 126/84     Pulse Rate 08/31/20 1222 72     Resp 08/31/20 1222 18     Temp 08/31/20 1222 98.1 F (36.7 C)     Temp src --      SpO2 08/31/20 1222 97 %     Weight --      Height --      Head Circumference --      Peak Flow --      Pain Score 08/31/20 1219 4     Pain Loc --      Pain Edu? --      Excl. in GC? --    No data found.  Updated Vital Signs BP 126/84   Pulse 72   Temp 98.1 F (36.7 C)   Resp 18   SpO2 97%   Visual Acuity Right Eye  Distance:   Left Eye Distance:   Bilateral Distance:    Right Eye Near:   Left Eye Near:    Bilateral Near:     Physical Exam Vitals and nursing note reviewed.  Constitutional:      General: He is not in acute distress.    Appearance: He is not ill-appearing.  Musculoskeletal:     Comments: Cystic lesion over the superior aspect of the right shoulder.  Lesion is firm, not attached to the overlying or underlying tissues.  It is nontender.  Measures about 1 inch in the longest diameter.  Not erythematous.  Neurological:     Mental Status: He is alert.      UC Treatments / Results  Labs (all labs ordered are listed, but only abnormal results are displayed) Labs Reviewed - No data to display  EKG   Radiology No results found.  Procedures Procedures (including critical care time)  Medications Ordered in UC Medications - No data to display  Initial Impression / Assessment and Plan / UC Course  I have reviewed the triage vital signs and the nursing notes.  Pertinent labs & imaging results that were available during my care of the patient were reviewed by me and considered in my medical decision making (see chart for details).     1.  Cyst of the right shoulder: Patient is advised to follow-up with his orthopedic surgeon No indication for aspiration at this time If swelling becomes painful or tender to touch she is advised to reach out to the orthopedic surgeon sooner There is no limitation in range of motion of the right shoulder. Final Clinical Impressions(s) / UC Diagnoses   Final diagnoses:  Cyst of joint of shoulder     Discharge Instructions     Please follow up with your orthopedic surgeon if the cyst gets bigger or becomes painful.   ED Prescriptions    None     PDMP not reviewed this encounter.   Merrilee Jansky, MD 09/03/20 1125

## 2020-11-07 ENCOUNTER — Other Ambulatory Visit: Payer: Self-pay | Admitting: Surgery

## 2020-11-07 DIAGNOSIS — M7989 Other specified soft tissue disorders: Secondary | ICD-10-CM

## 2020-11-30 ENCOUNTER — Ambulatory Visit
Admission: RE | Admit: 2020-11-30 | Discharge: 2020-11-30 | Disposition: A | Discharge status: Home / Self Care | Payer: BC Managed Care – PPO | Source: Ambulatory Visit | Attending: Surgery | Admitting: Surgery

## 2020-11-30 ENCOUNTER — Other Ambulatory Visit: Payer: Self-pay

## 2020-11-30 ENCOUNTER — Inpatient Hospital Stay: Admission: RE | Admit: 2020-11-30 | Payer: BC Managed Care – PPO | Source: Ambulatory Visit

## 2020-11-30 DIAGNOSIS — M7989 Other specified soft tissue disorders: Secondary | ICD-10-CM

## 2021-01-23 ENCOUNTER — Encounter: Payer: BC Managed Care – PPO | Attending: Internal Medicine | Admitting: Registered"

## 2021-04-06 ENCOUNTER — Ambulatory Visit (HOSPITAL_COMMUNITY)
Admission: EM | Admit: 2021-04-06 | Discharge: 2021-04-06 | Disposition: A | Discharge status: Home / Self Care | Payer: BC Managed Care – PPO | Attending: Emergency Medicine | Admitting: Emergency Medicine

## 2021-04-06 ENCOUNTER — Encounter (HOSPITAL_COMMUNITY): Payer: Self-pay | Admitting: Emergency Medicine

## 2021-04-06 DIAGNOSIS — M7031 Other bursitis of elbow, right elbow: Secondary | ICD-10-CM | POA: Diagnosis not present

## 2021-04-06 DIAGNOSIS — M25511 Pain in right shoulder: Secondary | ICD-10-CM | POA: Diagnosis not present

## 2021-04-06 DIAGNOSIS — G8929 Other chronic pain: Secondary | ICD-10-CM | POA: Diagnosis not present

## 2021-04-06 MED ORDER — CEPHALEXIN 500 MG PO CAPS
500.0000 mg | ORAL_CAPSULE | Freq: Four times a day (QID) | ORAL | 0 refills | Status: DC
Start: 2021-04-06 — End: 2021-10-19

## 2021-04-06 MED ORDER — PREDNISONE 10 MG (21) PO TBPK: ORAL_TABLET | Freq: Every day | ORAL | 0 refills | Status: DC

## 2021-04-06 NOTE — ED Triage Notes (Signed)
Pt reports right shoulder pains about 5 days and then causing swelling and pains in elbow area. Denies falls or new injuries. Pt reports taking medication for pain but not helping.

## 2021-04-06 NOTE — ED Provider Notes (Signed)
[facility]    CSN: [id number] Arrival date & time: 04/06/21  2202      History   Chief Complaint Chief Complaint  Patient presents with   Shoulder Pain   Arm Pain    HPI Manuel Melendez is a 60 y.o. male.   Pt has pain and swelling to rt elbow for 5 days. He had similar sx and bursitis to lt elbow in the past. Pt denies any injury. Has a long hx of arthritic pain and joint problems. He is currently seeing a pain clinic but the meds do not help. Denies any fevers.    Past Medical History:  Diagnosis Date   Arthritis    Hypercholesteremia    Hypertension    Seasonal allergies     Patient Active Problem List   Diagnosis Date Noted   Obstructive sleep apnea 01/12/2019   Snoring 12/12/2018    Past Surgical History:  Procedure Laterality Date   ANKLE SURGERY     per pt report   HAND SURGERY     per pt report   HERNIA REPAIR     KNEE SURGERY     per pt report   SHOULDER SURGERY Bilateral        Home Medications    Prior to Admission medications   Medication Sig Start Date End Date Taking? Authorizing Provider  cephALEXin (KEFLEX) 500 MG capsule Take 1 capsule (500 mg total) by mouth 4 (four) times daily. 04/06/21  Yes Coralyn Mark, NP  predniSONE (STERAPRED UNI-PAK 21 TAB) 10 MG (21) TBPK tablet Take by mouth daily. Take 6 tabs by mouth daily  for 2 days, then 5 tabs for 2 days, then 4 tabs for 2 days, then 3 tabs for 2 days, 2 tabs for 2 days, then 1 tab by mouth daily for 2 days 04/06/21  Yes Maple Mirza L, NP  amLODipine-benazepril (LOTREL) 10-20 MG capsule Take 1 capsule by mouth daily. 11/16/19   [provider]  carvedilol (COREG) 6.25 MG tablet Take 6.25 mg by mouth 2 (two) times daily. 11/16/19   [provider]  chlorthalidone (HYGROTON) 25 MG tablet Take 25 mg by mouth daily. 11/16/19   [provider]  clotrimazole-betamethasone (LOTRISONE) cream SMARTSIG:1 Topical Every Night 11/16/19   [provider]  dexlansoprazole (DEXILANT) 60 MG capsule Take 60 mg by mouth daily.    [provider]  fluticasone (FLONASE) 50 MCG/ACT nasal spray Place into both nostrils. 11/16/19   [provider]  ibuprofen (ADVIL) 800 MG tablet Take 800 mg by mouth 3 (three) times daily. 11/22/19   [provider]  methocarbamol (ROBAXIN) 500 MG tablet Take 1 tablet (500 mg total) by mouth 2 (two) times daily. 06/14/16   Liberty Handy, PA-C  naproxen (NAPROSYN) 375 MG tablet Take 1 tablet (375 mg total) by mouth 2 (two) times daily with a meal. 07/24/20   Wallis Bamberg, PA-C  oxyCODONE-acetaminophen (PERCOCET) 10-325 MG per tablet Take 1 tablet by mouth every 4 (four) hours as needed for pain.    [provider]  PRESCRIPTION MEDICATION Take 1 tablet by mouth daily. Combo blood pressure medication    [provider]  tamsulosin (FLOMAX) 0.4 MG CAPS capsule Take 0.4 mg by mouth daily.    [provider]    Family History Family History  Family history unknown: Yes    Social History Social History   Tobacco Use   Smoking status: Never   Smokeless tobacco:  Never  Substance Use Topics   Alcohol use: No   Drug use: No     Allergies   Other   Review of Systems Review of Systems  Constitutional:  Negative for activity change, fatigue and fever.  Respiratory: Negative.    Cardiovascular: Negative.   Gastrointestinal: Negative.   Genitourinary: Negative.   Musculoskeletal:  Positive for joint swelling.       Rt shoulder pain , rt elbow swelling and painful.   Skin:        Swelling to rt elbow   Neurological: Negative.     Physical Exam Triage Vital Signs ED Triage Vitals  Enc Vitals Group     BP 04/06/21 1137 134/88     Pulse Rate 04/06/21 1137 71     Resp 04/06/21 1137 20     Temp 04/06/21 1137 97.9 F (36.6 C)     Temp Source 04/06/21 1137 Oral     SpO2 04/06/21 1137 98 %     Weight --      Height --      Head  Circumference --      Peak Flow --      Pain Score 04/06/21 1135 10     Pain Loc --      Pain Edu? --      Excl. in GC? --    No data found.  Updated Vital Signs BP 134/88 (BP Location: Left Arm)   Pulse 71   Temp 97.9 F (36.6 C) (Oral)   Resp 20   SpO2 98%   Visual Acuity Right Eye Distance:   Left Eye Distance:   Bilateral Distance:    Right Eye Near:   Left Eye Near:    Bilateral Near:     Physical Exam Constitutional:      Appearance: Normal appearance. He is obese.  Cardiovascular:     Rate and Rhythm: Normal rate.  Pulmonary:     Effort: Pulmonary effort is normal.  Abdominal:     General: Abdomen is flat.  Musculoskeletal:        General: Swelling and tenderness present. No deformity or signs of injury.     Comments: Rt posterior side of elbow area erythema, moderate amount of edema. Tender to palpate. Decrease ROM with extending due to pain.   Skin:    Capillary Refill: Capillary refill takes 2 to 3 seconds.     Findings: Erythema present.  Neurological:     General: No focal deficit present.     Mental Status: He is alert.     UC Treatments / Results  Labs (all labs ordered are listed, but only abnormal results are displayed) Labs Reviewed - No data to display  EKG   Radiology No results found.  Procedures Procedures (including critical care time)  Medications Ordered in UC Medications - No data to display  Initial Impression / Assessment and Plan / UC Course  I have reviewed the triage vital signs and the nursing notes.  Pertinent labs & imaging results that were available during my care of the patient were reviewed by me and considered in my medical decision making (see chart for details).     You will need to follow up with ortho and or a rheumatology  Take full dose of abx with food  Use warm compress as need for pain.  Cont to take your pain meds as needed If symptoms become worse go to er   Final Clinical Impressions(s) / UC  Diagnoses   Final diagnoses:  Chronic right shoulder pain  Bursitis of right elbow, unspecified bursa     Discharge Instructions      You will need to follow up with ortho and or a rheumatology  Take full dose of abx with food  Use warm compress as need for pain.  Cont to take your pain meds as needed If symptoms become worse go to er      ED Prescriptions     Medication Sig Dispense Auth. Provider   predniSONE (STERAPRED UNI-PAK 21 TAB) 10 MG (21) TBPK tablet Take by mouth daily. Take 6 tabs by mouth daily  for 2 days, then 5 tabs for 2 days, then 4 tabs for 2 days, then 3 tabs for 2 days, 2 tabs for 2 days, then 1 tab by mouth daily for 2 days 42 tablet Maple Mirza L, NP   cephALEXin (KEFLEX) 500 MG capsule Take 1 capsule (500 mg total) by mouth 4 (four) times daily. 20 capsule Coralyn Mark, NP      PDMP not reviewed this encounter.   Coralyn Mark, NP 04/06/21 1159

## 2021-04-06 NOTE — Discharge Instructions (Addendum)
You will need to follow up with ortho and or a rheumatology  Take full dose of abx with food  Use warm compress as need for pain.  Cont to take your pain meds as needed If symptoms become worse go to er

## 2021-05-02 ENCOUNTER — Emergency Department (HOSPITAL_COMMUNITY)
Admission: EM | Admit: 2021-05-02 | Discharge: 2021-05-03 | Disposition: A | Discharge status: Home / Self Care | Payer: BC Managed Care – PPO | Attending: Emergency Medicine | Admitting: Emergency Medicine

## 2021-05-02 ENCOUNTER — Other Ambulatory Visit: Payer: Self-pay

## 2021-05-02 ENCOUNTER — Encounter (HOSPITAL_COMMUNITY): Payer: Self-pay

## 2021-05-02 ENCOUNTER — Emergency Department (HOSPITAL_COMMUNITY): Payer: BC Managed Care – PPO

## 2021-05-02 DIAGNOSIS — I10 Essential (primary) hypertension: Secondary | ICD-10-CM | POA: Diagnosis not present

## 2021-05-02 DIAGNOSIS — Y92410 Unspecified street and highway as the place of occurrence of the external cause: Secondary | ICD-10-CM | POA: Insufficient documentation

## 2021-05-02 DIAGNOSIS — M25511 Pain in right shoulder: Secondary | ICD-10-CM | POA: Insufficient documentation

## 2021-05-02 DIAGNOSIS — Z79899 Other long term (current) drug therapy: Secondary | ICD-10-CM | POA: Diagnosis not present

## 2021-05-02 DIAGNOSIS — M79641 Pain in right hand: Secondary | ICD-10-CM | POA: Diagnosis not present
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
## 2021-05-03 ENCOUNTER — Emergency Department (HOSPITAL_COMMUNITY): Payer: BC Managed Care – PPO
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
## 2021-05-03 NOTE — ED Notes (Signed)
I provided reinforced discharge education based off of discharge instructions. Pt acknowledged and understood my education. Pt had no further questions/concerns for provider/myself.  °

## 2021-06-18 ENCOUNTER — Ambulatory Visit: Payer: BC Managed Care – PPO | Admitting: Primary Care

## 2021-06-18 ENCOUNTER — Ambulatory Visit: Payer: BC Managed Care – PPO | Admitting: Rheumatology

## 2021-06-18 NOTE — Progress Notes (Unsigned)
@Patient  ID: Manuel Melendez, male    DOB: 10/04/1960, 61 y.o.   MRN: [id number]  No chief complaint on file.   Referring provider: Audley Hose, MD  HPI: 61 year old male, never smoked. PMH significant for OSA, hypertension. Patient of Dr. Ander Slade, last seen on 08/24/19. Maintained on CPAP at 19cm h20.    Previous [facility] pulmonary encounter: 11/26/2019 Patient presents today for 3 month follow-up. He reports compliance with CPAP but states that he hasn't used it the last 4-5 days because his needs a new mask. He has not been sleeping well without CPAP. Appeared tired today. States that he hasn't received new supplies in 1 year. He uses a full face mask. Reports that he is experiencing air leak from mask. DME company is [employer].   06/18/2021 Patient presents today for overdue OSA follow-up. He was seen by another provider with [facility] once in December 2021, ordered new CPAP supplies and was started on adderall 10mg  TID for daytime functioning. He had a repeat sleep study in April 2022 that showed severe obstructive sleep apnea, AHI 66.4/hr.     Allergies  Allergen Reactions   Other Other (See Comments)    Pt does not remember name of medication but states began having lower extremity pain after cholesterol medicine.    Immunization History  Administered Date(s) Administered   Influenza,inj,Quad PF,6-35 Mos 02/28/2019   PFIZER(Purple Top)SARS-COV-2 Vaccination 12/28/2019, 01/20/2020   Tdap 09/28/2013    Past Medical History:  Diagnosis Date   Arthritis    Hypercholesteremia    Hypertension    Seasonal allergies     Tobacco History: Social History   Tobacco Use  Smoking Status Never  Smokeless Tobacco Never   Counseling given: Not Answered   Outpatient Medications Prior to Visit  Medication Sig Dispense Refill   amLODipine-benazepril (LOTREL) 10-20 MG capsule Take 1 capsule by mouth daily.     carvedilol (COREG) 6.25 MG tablet Take 6.25  mg by mouth 2 (two) times daily.     cephALEXin (KEFLEX) 500 MG capsule Take 1 capsule (500 mg total) by mouth 4 (four) times daily. 20 capsule 0   chlorthalidone (HYGROTON) 25 MG tablet Take 25 mg by mouth daily.     clotrimazole-betamethasone (LOTRISONE) cream SMARTSIG:1 Topical Every Night     dexlansoprazole (DEXILANT) 60 MG capsule Take 60 mg by mouth daily.     fluticasone (FLONASE) 50 MCG/ACT nasal spray Place into both nostrils.     ibuprofen (ADVIL) 800 MG tablet Take 800 mg by mouth 3 (three) times daily.     methocarbamol (ROBAXIN) 500 MG tablet Take 1 tablet (500 mg total) by mouth 2 (two) times daily. 20 tablet 0   naproxen (NAPROSYN) 375 MG tablet Take 1 tablet (375 mg total) by mouth 2 (two) times daily with a meal. 30 tablet 0   oxyCODONE-acetaminophen (PERCOCET) 10-325 MG per tablet Take 1 tablet by mouth every 4 (four) hours as needed for pain.     predniSONE (STERAPRED UNI-PAK 21 TAB) 10 MG (21) TBPK tablet Take by mouth daily. Take 6 tabs by mouth daily  for 2 days, then 5 tabs for 2 days, then 4 tabs for 2 days, then 3 tabs for 2 days, 2 tabs for 2 days, then 1 tab by mouth daily for 2 days 42 tablet 0   PRESCRIPTION MEDICATION Take 1 tablet by mouth daily. Combo blood pressure medication     tamsulosin (FLOMAX) 0.4 MG CAPS capsule Take  0.4 mg by mouth daily.     No facility-administered medications prior to visit.      Review of Systems  Review of Systems   Physical Exam  There were no vitals taken for this visit. Physical Exam   Lab Results:  CBC    Component Value Date/Time   WBC 12.5 (H) 07/24/2020 1030   RBC 4.87 07/24/2020 1030   HGB 14.8 07/24/2020 1030   HCT 44.7 07/24/2020 1030   PLT 240 07/24/2020 1030   MCV 91.8 07/24/2020 1030   MCH 30.4 07/24/2020 1030   MCHC 33.1 07/24/2020 1030   RDW 14.0 07/24/2020 1030   LYMPHSABS 2.3 07/24/2020 1030   MONOABS 0.9 07/24/2020 1030   EOSABS 0.6 (H) 07/24/2020 1030   BASOSABS 0.0 07/24/2020 1030     BMET No results found for: NA, K, CL, CO2, GLUCOSE, BUN, CREATININE, CALCIUM, GFRNONAA, GFRAA  BNP No results found for: BNP  ProBNP No results found for: PROBNP  Imaging: No results found.   Assessment & Plan:   No problem-specific Assessment & Plan notes found for this encounter.     Martyn Ehrich, NP 06/18/2021

## 2021-06-20 ENCOUNTER — Ambulatory Visit (INDEPENDENT_AMBULATORY_CARE_PROVIDER_SITE_OTHER): Payer: BC Managed Care – PPO | Admitting: Primary Care

## 2021-06-20 ENCOUNTER — Encounter: Payer: Self-pay | Admitting: Primary Care

## 2021-06-20 ENCOUNTER — Other Ambulatory Visit: Payer: Self-pay

## 2021-06-20 DIAGNOSIS — E559 Vitamin D deficiency, unspecified: Secondary | ICD-10-CM | POA: Diagnosis not present

## 2021-06-20 DIAGNOSIS — G4733 Obstructive sleep apnea (adult) (pediatric): Secondary | ICD-10-CM

## 2021-06-20 NOTE — Patient Instructions (Signed)
Recommendations: Continue to wear CPAP every night for 4-6 hours or more Do not drive if experiencing excessive daytime sleepiness Work on weight loss efforts   Follow-up: 1 year with [name] or APP   CPAP and BIPAP Information CPAP and BIPAP are methods that use air pressure to keep your airways open and to help you breathe well. CPAP and BIPAP use different amounts of pressure. Your health care provider will tell you whether CPAP or BIPAP would be more helpful for you. CPAP stands for "continuous positive airway pressure." With CPAP, the amount of pressure stays the same while you breathe in (inhale) and out (exhale). BIPAP stands for "bi-level positive airway pressure." With BIPAP, the amount of pressure will be higher when you inhale and lower when you exhale. This allows you to take larger breaths. CPAP or BIPAP may be used in the hospital, or your health care provider may want you to use it at home. You may need to have a sleep study before your health care provider can order a machine for you to use at home. What are the advantages? CPAP or BIPAP can be helpful if you have: Sleep apnea. Chronic obstructive pulmonary disease (COPD). Heart failure. Medical conditions that cause muscle weakness, including muscular dystrophy or amyotrophic lateral sclerosis (ALS). Other problems that cause breathing to be shallow, weak, abnormal, or difficult. CPAP and BIPAP are most commonly used for obstructive sleep apnea (OSA) to keep the airways from collapsing when the muscles relax during sleep. What are the risks? Generally, this is a safe treatment. However, problems may occur, including: Irritated skin or skin sores if the mask does not fit properly. Dry or stuffy nose or nosebleeds. Dry mouth. Feeling gassy or bloated. Sinus or lung infection if the equipment is not cleaned properly. When should CPAP or BIPAP be used? In most cases, the mask only needs to be worn during sleep.  Generally, the mask needs to be worn throughout the night and during any daytime naps. People with certain medical conditions may also need to wear the mask at other times, such as when they are awake. Follow instructions from your health care provider about when to use the machine. What happens during CPAP or BIPAP? Both CPAP and BIPAP are provided by a small machine with a flexible plastic tube that attaches to a plastic mask that you wear. Air is blown through the mask into your nose or mouth. The amount of pressure that is used to blow the air can be adjusted on the machine. Your health care provider will set the pressure setting and help you find the best mask for you. Tips for using the mask Because the mask needs to be snug, some people feel trapped or closed-in (claustrophobic) when first using the mask. If you feel this way, you may need to get used to the mask. One way to do this is to hold the mask loosely over your nose or mouth and then gradually apply the mask more snugly. You can also gradually increase the amount of time that you use the mask. Masks are available in various types and sizes. If your mask does not fit well, talk with your health care provider about getting a different one. Some common types of masks include: Full face masks, which fit over the mouth and nose. Nasal masks, which fit over the nose. Nasal pillow or prong masks, which fit into the nostrils. If you are using a mask that fits over your nose and  you tend to breathe through your mouth, a chin strap may be applied to help keep your mouth closed. Use a skin barrier to protect your skin as told by your health care provider. Some CPAP and BIPAP machines have alarms that may sound if the mask comes off or develops a leak. If you have trouble with the mask, it is very important that you talk with your health care provider about finding a way to make the mask easier to tolerate. Do not stop using the mask. There could be  a negative impact on your health if you stop using the mask. Tips for using the machine Place your CPAP or BIPAP machine on a secure table or stand near an electrical outlet. Know where the on/off switch is on the machine. Follow instructions from your health care provider about how to set the pressure on your machine and when you should use it. Do not eat or drink while the CPAP or BIPAP machine is on. Food or fluids could get pushed into your lungs by the pressure of the CPAP or BIPAP. For home use, CPAP and BIPAP machines can be rented or purchased through home health care companies. Many different brands of machines are available. Renting a machine before purchasing may help you find out which particular machine works well for you. Your health insurance company may also decide which machine you may get. Keep the CPAP or BIPAP machine and attachments clean. Ask your health care provider for specific instructions. Check the humidifier if you have a dry stuffy nose or nosebleeds. Make sure it is working correctly. Follow these instructions at home: Take over-the-counter and prescription medicines only as told by your health care provider. Ask if you can take sinus medicine if your sinuses are blocked. Do not use any products that contain nicotine or tobacco. These products include cigarettes, chewing tobacco, and vaping devices, such as e-cigarettes. If you need help quitting, ask your health care provider. Keep all follow-up visits. This is important. Contact a health care provider if: You have redness or pressure sores on your head, face, mouth, or nose from the mask or head gear. You have trouble using the CPAP or BIPAP machine. You cannot tolerate wearing the CPAP or BIPAP mask. Someone tells you that you snore even when wearing your CPAP or BIPAP. Get help right away if: You have trouble breathing. You feel confused. Summary CPAP and BIPAP are methods that use air pressure to keep your  airways open and to help you breathe well. If you have trouble with the mask, it is very important that you talk with your health care provider about finding a way to make the mask easier to tolerate. Do not stop using the mask. There could be a negative impact to your health if you stop using the mask. Follow instructions from your health care provider about when to use the machine. This information is not intended to replace advice given to you by your health care provider. Make sure you discuss any questions you have with your health care provider. Document Revised: 11/22/2020 Document Reviewed: 03/24/2020 Elsevier Patient Education  2022 [employer].

## 2021-06-20 NOTE — Assessment & Plan Note (Addendum)
-   Patient is 72% compliant with CPAP use > 4 hours. He has some residual daytime fatigue. Epworth 5. He was taking adderall for daytime functioning but no longer. Pressure 10-20cm h20 (16cm h20-95%); residual AHI 4.4/hr. No changes to pressure setting today. Recommend patient aim to wear CPAP every night for min 4-6 hours or longer. Encourage weight loss efforts. Advised against driving if experiencing excessive daytime sleepiness. FU in 1 year with Dr. Ander Slade or sooner if needed.

## 2021-06-20 NOTE — Progress Notes (Signed)
@Patient  ID: Manuel Melendez, male    DOB: 02-Mar-1961, 61 y.o.   MRN: [id number]  Chief Complaint  Patient presents with   Sleep Apnea    Referring provider: Harvest Forest, MD  HPI: 61 year old male, never smoked. PMH significant for OSA, hypertension. Patient of [name], last seen on 08/24/19. Maintained on CPAP at 19cm h20.    Previous [facility] pulmonary encounter: 11/26/2019 Patient presents today for 3 month follow-up. He reports compliance with CPAP but states that he hasn't used it the last 4-5 days because his needs a new mask. He has not been sleeping well without CPAP. Appeared tired today. States that he hasn't received new supplies in 1 year. He uses a full face mask. Reports that he is experiencing air leak from mask. DME company is [employer].   06/18/2021- Interim hx  Patient presents today for overdue OSA follow-up. He was seen by another provider with [facility] once in December 2021, ordered new CPAP supplies and was started on adderall 10mg  TID for daytime functioning. He tells me he went a year without using CPAP d/t recall. He had a repeat sleep study in April 2022 that showed severe obstructive sleep apnea, AHI 66.4/hr. He received new CPAP back in November d/t machine recall. He is 72% compliant with use > 4 hours. Average usage 4 hours 37 mins. He has some daytime fatigue despite wearing CPAP. PMP reviewed and patient is no longer taking adderall. PCP checked some labs and his vit D was low.   Airview download 03/26/21-06/12/21 76/79 days (96%); 57 (72%) > 4 hours  Average usage 4 hours 37 mins Pressure 10-20cm h20 (16cm h20-95%) Airleaks 8.5L/min AHI 4.4    Allergies  Allergen Reactions   Other Other (See Comments)    Pt does not remember name of medication but states began having lower extremity pain after cholesterol medicine.    Immunization History  Administered Date(s) Administered   Influenza,inj,Quad PF,6-35 Mos 02/28/2019    PFIZER(Purple Top)SARS-COV-2 Vaccination 12/28/2019, 01/20/2020   Tdap 09/28/2013    Past Medical History:  Diagnosis Date   Arthritis    Hypercholesteremia    Hypertension    Seasonal allergies     Tobacco History: Social History   Tobacco Use  Smoking Status Never  Smokeless Tobacco Never   Counseling given: Not Answered   Outpatient Medications Prior to Visit  Medication Sig Dispense Refill   amLODipine-benazepril (LOTREL) 10-20 MG capsule Take 1 capsule by mouth daily.     carvedilol (COREG) 6.25 MG tablet Take 6.25 mg by mouth 2 (two) times daily.     cephALEXin (KEFLEX) 500 MG capsule Take 1 capsule (500 mg total) by mouth 4 (four) times daily. 20 capsule 0   chlorthalidone (HYGROTON) 25 MG tablet Take 25 mg by mouth daily.     clotrimazole-betamethasone (LOTRISONE) cream SMARTSIG:1 Topical Every Night     dexlansoprazole (DEXILANT) 60 MG capsule Take 60 mg by mouth daily.     fluticasone (FLONASE) 50 MCG/ACT nasal spray Place into both nostrils.     ibuprofen (ADVIL) 800 MG tablet Take 800 mg by mouth 3 (three) times daily.     methocarbamol (ROBAXIN) 500 MG tablet Take 1 tablet (500 mg total) by mouth 2 (two) times daily. 20 tablet 0   naproxen (NAPROSYN) 375 MG tablet Take 1 tablet (375 mg total) by mouth 2 (two) times daily with a meal. 30 tablet 0   oxyCODONE-acetaminophen (PERCOCET) 10-325 MG per tablet  Take 1 tablet by mouth every 4 (four) hours as needed for pain.     predniSONE (STERAPRED UNI-PAK 21 TAB) 10 MG (21) TBPK tablet Take by mouth daily. Take 6 tabs by mouth daily  for 2 days, then 5 tabs for 2 days, then 4 tabs for 2 days, then 3 tabs for 2 days, 2 tabs for 2 days, then 1 tab by mouth daily for 2 days 42 tablet 0   PRESCRIPTION MEDICATION Take 1 tablet by mouth daily. Combo blood pressure medication     tamsulosin (FLOMAX) 0.4 MG CAPS capsule Take 0.4 mg by mouth daily.     No facility-administered medications prior to visit.    Review of  Systems  Review of Systems  Constitutional:  Positive for fatigue.  HENT: Negative.    Respiratory: Negative.    Psychiatric/Behavioral:  Positive for sleep disturbance.     Physical Exam  BP 130/86    Pulse 85    Ht 6' (1.829 m)    Wt (!) 312 lb 3.2 oz (141.6 kg)    SpO2 97%    BMI 42.34 kg/m  Physical Exam Constitutional:      Appearance: Normal appearance.  HENT:     Head: Normocephalic and atraumatic.     Mouth/Throat:     Mouth: Mucous membranes are moist.     Pharynx: Oropharynx is clear.  Cardiovascular:     Rate and Rhythm: Normal rate and regular rhythm.  Pulmonary:     Effort: Pulmonary effort is normal.     Breath sounds: Normal breath sounds. No wheezing, rhonchi or rales.  Musculoskeletal:        General: Normal range of motion.  Skin:    General: Skin is warm and dry.  Neurological:     General: No focal deficit present.     Mental Status: He is alert and oriented to person, place, and time. Mental status is at baseline.  Psychiatric:        Mood and Affect: Mood normal.        Behavior: Behavior normal.        Thought Content: Thought content normal.        Judgment: Judgment normal.     Lab Results:  CBC    Component Value Date/Time   WBC 12.5 (H) 07/24/2020 1030   RBC 4.87 07/24/2020 1030   HGB 14.8 07/24/2020 1030   HCT 44.7 07/24/2020 1030   PLT 240 07/24/2020 1030   MCV 91.8 07/24/2020 1030   MCH 30.4 07/24/2020 1030   MCHC 33.1 07/24/2020 1030   RDW 14.0 07/24/2020 1030   LYMPHSABS 2.3 07/24/2020 1030   MONOABS 0.9 07/24/2020 1030   EOSABS 0.6 (H) 07/24/2020 1030   BASOSABS 0.0 07/24/2020 1030    BMET No results found for: NA, K, CL, CO2, GLUCOSE, BUN, CREATININE, CALCIUM, GFRNONAA, GFRAA  BNP No results found for: BNP  ProBNP No results found for: PROBNP  Imaging: No results found.   Assessment & Plan:   Obstructive sleep apnea - Patient is 72% compliant with CPAP use > 4 hours. He has some residual daytime fatigue.  Epworth 5. He was taking adderall for daytime functioning but no longer. Pressure 10-20cm h20 (16cm h20-95%); residual AHI 4.4/hr. No changes to pressure setting today. Recommend patient aim to wear CPAP every night for min 4-6 hours or longer. Encourage weight loss efforts. Advised against driving if experiencing excessive daytime sleepiness. FU in 1 year with [name] or sooner if  needed.    Vitamin D deficiency - Continue Vit D2 50,000 IU    Glenford Bayley, NP 06/20/2021

## 2021-06-20 NOTE — Assessment & Plan Note (Signed)
-   Continue Vit D2 50,000 IU

## 2021-07-11 ENCOUNTER — Ambulatory Visit: Payer: BC Managed Care – PPO | Admitting: Rheumatology

## 2021-07-12 ENCOUNTER — Other Ambulatory Visit: Payer: Self-pay | Admitting: Urology

## 2021-07-18 NOTE — Progress Notes (Deleted)
? ?  Office Visit Note ? ?Patient: Manuel Melendez             ?Date of Birth: October 30, 1960           ?MRN: [id number]             ?PCP: Audley Hose, MD ?Referring: Audley Hose, MD ?Visit Date: 08/01/2021 ?Occupation: @GUAROCC @ ? ?Subjective:  ?No chief complaint on file. ? ? ?History of Present Illness: Manuel Melendez is a 61 y.o. male ***  ? ?Activities of Daily Living:  ?Patient reports morning stiffness for *** {minute/hour:19697}.   ?Patient {ACTIONS;DENIES/REPORTS:21021675::"Denies"} nocturnal pain.  ?Difficulty dressing/grooming: {ACTIONS;DENIES/REPORTS:21021675::"Denies"} ?Difficulty climbing stairs: {ACTIONS;DENIES/REPORTS:21021675::"Denies"} ?Difficulty getting out of chair: {ACTIONS;DENIES/REPORTS:21021675::"Denies"} ?Difficulty using hands for taps, buttons, cutlery, and/or writing: {ACTIONS;DENIES/REPORTS:21021675::"Denies"} ? ?No Rheumatology ROS completed.  ? ?PMFS History:  ?Patient Active Problem List  ? Diagnosis Date Noted  ? Vitamin D deficiency 06/20/2021  ? Obstructive sleep apnea 01/12/2019  ? Snoring 12/12/2018  ?  ?Past Medical History:  ?Diagnosis Date  ? Arthritis   ? Hypercholesteremia   ? Hypertension   ? Seasonal allergies   ?  ?Family History  ?Family history unknown: Yes  ? ?Past Surgical History:  ?Procedure Laterality Date  ? ANKLE SURGERY    ? per pt report  ? HAND SURGERY    ? per pt report  ? HERNIA REPAIR    ? KNEE SURGERY    ? per pt report  ? SHOULDER SURGERY Bilateral   ? ?Social History  ? ?Social History Narrative  ? ** Merged History Encounter **  ?    ? ?Immunization History  ?Administered Date(s) Administered  ? Influenza,inj,Quad PF,6-35 Mos 02/28/2019  ? PFIZER(Purple Top)SARS-COV-2 Vaccination 12/28/2019, 01/20/2020  ? Tdap 09/28/2013  ?  ? ?Objective: ?Vital Signs: There were no vitals taken for this visit.  ? ?Physical Exam  ? ?Musculoskeletal Exam: *** ? ?CDAI Exam: ?CDAI Score: -- ?Patient Global: --; Provider Global: -- ?Swollen: --; Tender:  -- ?Joint Exam 08/01/2021  ? ?No joint exam has been documented for this visit  ? ?There is currently no information documented on the homunculus. Go to the Rheumatology activity and complete the homunculus joint exam. ? ?Investigation: ?No additional findings. ? ?Imaging: ?No results found. ? ?Recent Labs: ?Lab Results  ?Component Value Date  ? WBC 12.5 (H) 07/24/2020  ? HGB 14.8 07/24/2020  ? PLT 240 07/24/2020  ? ? ?Speciality Comments: No specialty comments available. ? ?Procedures:  ?No procedures performed ?Allergies: Other  ? ?Assessment / Plan:     ?Visit Diagnoses: Chronic right shoulder pain ? ?Olecranon bursitis of right elbow ? ?Obstructive sleep apnea ? ?Vitamin D deficiency ? ?Essential hypertension ? ?Hypercholesteremia ? ?Orders: ?No orders of the defined types were placed in this encounter. ? ?No orders of the defined types were placed in this encounter. ? ? ?Face-to-face time spent with patient was *** minutes. Greater than 50% of time was spent in counseling and coordination of care. ? ?Follow-Up Instructions: No follow-ups on file. ? ? ?Ofilia Neas, PA-C ? ?Note - This record has been created using [employer].  ?Chart creation errors have been sought, but may not always  ?have been located. Such creation errors do not reflect on  ?the standard of medical care. ? ?

## 2021-08-01 ENCOUNTER — Ambulatory Visit: Payer: BC Managed Care – PPO | Admitting: Rheumatology

## 2021-08-01 DIAGNOSIS — E559 Vitamin D deficiency, unspecified: Secondary | ICD-10-CM

## 2021-08-01 DIAGNOSIS — I10 Essential (primary) hypertension: Secondary | ICD-10-CM

## 2021-08-01 DIAGNOSIS — G4733 Obstructive sleep apnea (adult) (pediatric): Secondary | ICD-10-CM

## 2021-08-01 DIAGNOSIS — G8929 Other chronic pain: Secondary | ICD-10-CM

## 2021-08-01 DIAGNOSIS — E7800 Pure hypercholesterolemia, unspecified: Secondary | ICD-10-CM

## 2021-08-01 DIAGNOSIS — M7021 Olecranon bursitis, right elbow: Secondary | ICD-10-CM

## 2021-08-13 ENCOUNTER — Ambulatory Visit
Admission: RE | Admit: 2021-08-13 | Discharge: 2021-08-13 | Disposition: A | Discharge status: Home / Self Care | Payer: BC Managed Care – PPO | Source: Ambulatory Visit | Attending: Urology | Admitting: Urology

## 2021-08-13 DIAGNOSIS — R972 Elevated prostate specific antigen [PSA]: Secondary | ICD-10-CM

## 2021-08-13 MED ORDER — GADOBENATE DIMEGLUMINE 529 MG/ML IV SOLN
20.0000 mL | Freq: Once | INTRAVENOUS | Status: AC | PRN
  Administered 2021-08-13: 20 mL via INTRAVENOUS

## 2021-10-19 ENCOUNTER — Ambulatory Visit: Payer: BC Managed Care – PPO | Admitting: Podiatry

## 2021-10-19 ENCOUNTER — Encounter: Payer: Self-pay | Admitting: Podiatry

## 2021-10-19 DIAGNOSIS — L600 Ingrowing nail: Secondary | ICD-10-CM | POA: Diagnosis not present

## 2021-10-19 DIAGNOSIS — B351 Tinea unguium: Secondary | ICD-10-CM | POA: Diagnosis not present

## 2022-09-01 IMAGING — MR MR PROSTATE WO/W CM
12 series · 48 of 48 positions shown · IV contrast (multihance)
Comparison: None.

CLINICAL DATA: Elevated PSA.

EXAM:
MR PROSTATE WITHOUT AND WITH CONTRAST
TECHNIQUE: Multiplanar multisequence MRI images were obtained of the pelvis
centered about the prostate. Pre and post contrast images were
obtained.
CONTRAST:  20mL MULTIHANCE GADOBENATE DIMEGLUMINE 529 MG/ML IV SOLN

[Series 4: T2 · coronal · 3.0mm · 0.56mm/px · 1 of 25 slices shown (1 of 3)]
[im 1/25]
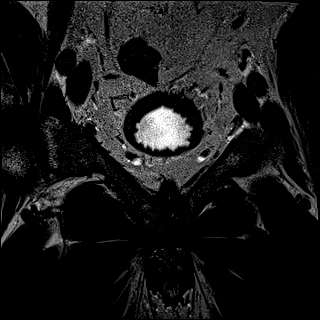

[Series 5: T1 · axial · 5.0mm · 1.25mm/px · 1 of 80 slices shown]
[im 1/80]
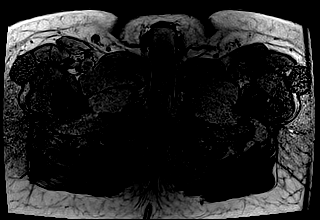

[Series 6: DWI · axial · 3.0mm · 1.75mm/px · z∈[+117,+195]mm · 2 of 79 slices shown (1 of 3)]
[im 1/79]
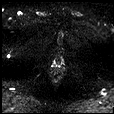
[im 79/79]
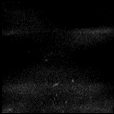

[Series 7: DWI · axial · 3.0mm · 1.75mm/px · 1 of 27 slices shown (2 of 3)]
[im 1/27]
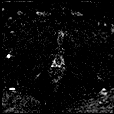

[Series 8: DWI · axial · 3.0mm · 1.75mm/px · 1 of 26 slices shown (3 of 3)]
[im 1/26]
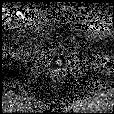

[Series 9: T2 · axial · 3.0mm · 0.56mm/px · 1 of 27 slices shown (2 of 3)]
[im 1/27]
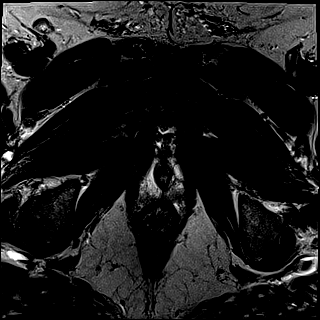

[Series 10: T2 · axial · 1.0mm · 1.04mm/px · z∈[+117,+196]mm · 2 of 80 slices shown (3 of 3)]
[im 1/80]
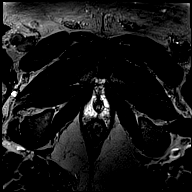
[im 80/80]
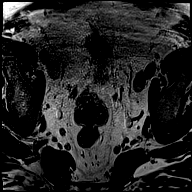

[Series 11: pre t1_twist_tra_dyn · axial · non-contrast · 3.5mm · 0.83mm/px · 1 of 22 slices shown]
[im 1/22]
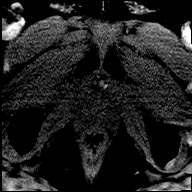

[Series 12: post t1_twist_tra_dyn-copy center · axial · non-contrast · 3.5mm · 0.83mm/px · z∈[+120,+193]mm · 17 of 660 slices shown]
[im 1/660]
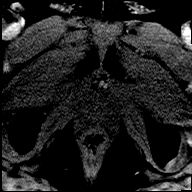
[im 42/660]
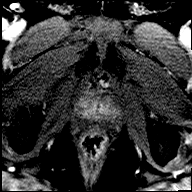
[im 83/660]
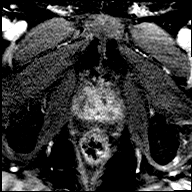
[im 124/660]
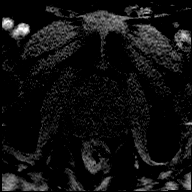
[im 165/660]
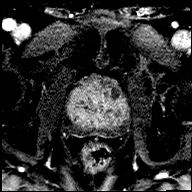
[im 206/660]
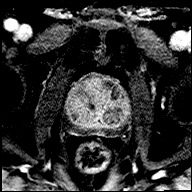
[im 248/660]
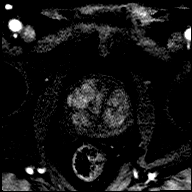
[im 289/660]
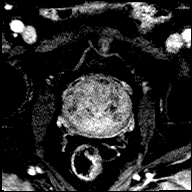
[im 330/660]
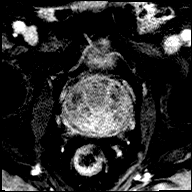
[im 371/660]
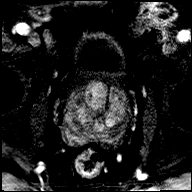
[im 412/660]
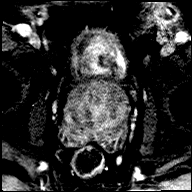
[im 454/660]
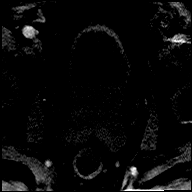
[im 495/660]
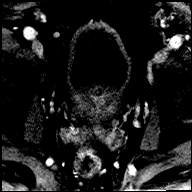
[im 536/660]
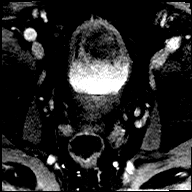
[im 577/660]
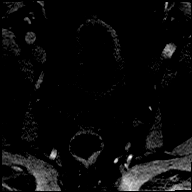
[im 618/660]
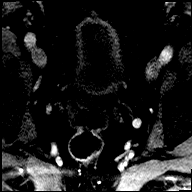
[im 660/660]
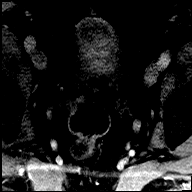

[Series 13: post t1_twist_tra_dyn-copy cent_sub · axial · 3.5mm · 0.83mm/px · z∈[+120,+193]mm · 17 of 625 slices shown]
[im 1/625]
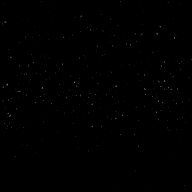
[im 40/625]
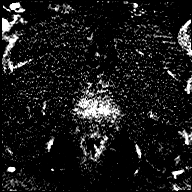
[im 79/625]
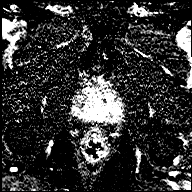
[im 118/625]
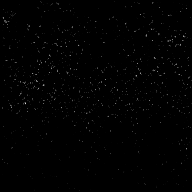
[im 157/625]
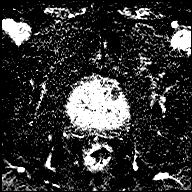
[im 196/625]
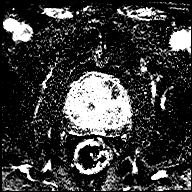
[im 235/625]
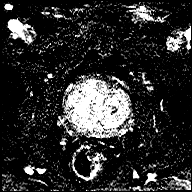
[im 274/625]
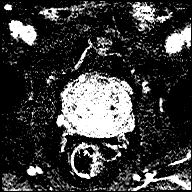
[im 313/625]
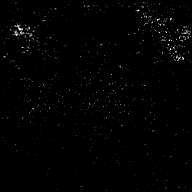
[im 352/625]
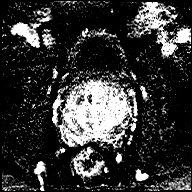
[im 391/625]
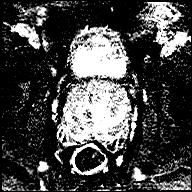
[im 430/625]
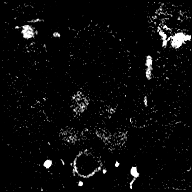
[im 469/625]
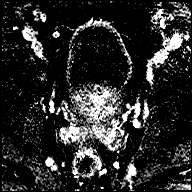
[im 508/625]
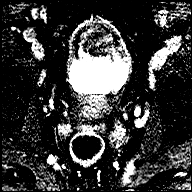
[im 547/625]
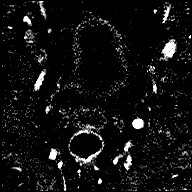
[im 586/625]
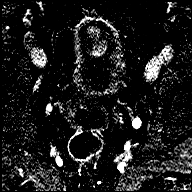
[im 625/625]
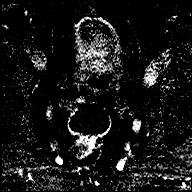

[Series 14: t1_vibe_dixon_tra_f · axial · 2.5mm · 0.91mm/px · z∈[+83,+281]mm · 2 of 80 slices shown]
[im 1/80]
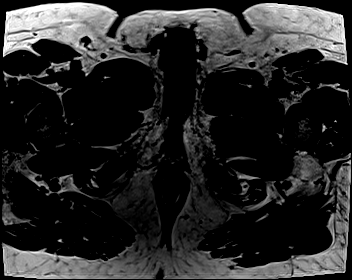
[im 80/80]
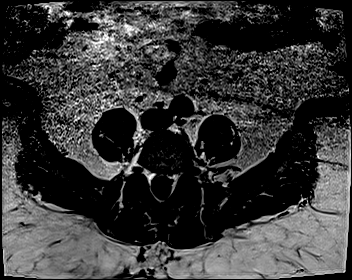

[Series 15: t1_vibe_dixon_tra_w · axial · 2.5mm · 0.91mm/px · z∈[+83,+281]mm · 2 of 80 slices shown]
[im 1/80]
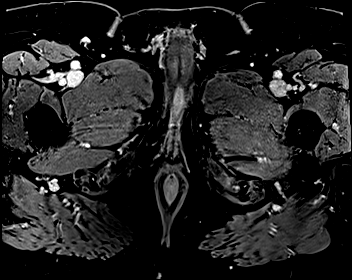
[im 80/80]
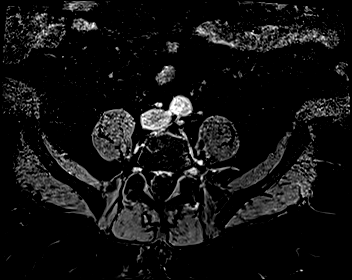

[48 of 48 positions shown; findings below may reference images not displayed]

FINDINGS: Prostate:

-- Peripheral Zone: No focal lesion seen on ADC or high b-value DWI
sequences.

-- Transition/Central Zone: Moderately enlarged with diffuse
involvement by BPH nodules. No nodules with suspicious
characteristics on T2-weighted imaging.

-- Measurements/Volume:  6.7 by 5.3 x 5.8 cm (volume = 110 cm^3)

Transcapsular spread:  Absent

Seminal vesicle involvement:  Absent

Neurovascular bundle involvement:  Absent

Pelvic adenopathy: None visualized

Bone metastasis: None visualized

Other:  None
IMPRESSION: No radiographic evidence of high-grade prostate carcinoma. PI-RADS 1
(v2.1): Very Low (clinically significant cancer highly unlikely)

## 2023-03-19 NOTE — Progress Notes (Unsigned)
@Patient  ID: Manuel Melendez, male    DOB: 09/07/1960, 62 y.o.   MRN: [id number]  No chief complaint on file.   Referring provider: Harvest [place], MD  HPI: 62 year old male, never smoked. PMH significant for OSA, hypertension. Patient of Dr. Wynona Melendez.   Previous [facility] pulmonary encounter: 11/26/2019 Patient presents today for 3 month follow-up. He reports compliance with CPAP but states that he hasn't used it the last 4-5 days because his needs a new mask. He has not been sleeping well without CPAP. Appeared tired today. States that he hasn't received new supplies in 1 year. He uses a full face mask. Reports that he is experiencing air leak from mask. DME company is [employer].   06/18/2021 Patient presents today for overdue OSA follow-up. He was seen by another provider with [facility] once in December 2021, ordered new CPAP supplies and was started on adderall 10mg  TID for daytime functioning. He tells me he went a year without using CPAP d/t recall. He had a repeat sleep study in April 2022 that showed severe obstructive sleep apnea, AHI 66.4/hr. He received new CPAP back in November d/t machine recall. He is 72% compliant with use > 4 hours. Average usage 4 hours 37 mins. He has some daytime fatigue despite wearing CPAP. PMP reviewed and patient is no longer taking adderall. PCP checked some labs and his vit D was low.   Airview download 03/26/21-06/12/21 76/79 days (96%); 57 (72%) > 4 hours  Average usage 4 hours 37 mins Pressure 10-20cm h20 (16cm h20-95%) Airleaks 8.5L/min AHI 4.4   Obstructive sleep apnea - Patient is 72% compliant with CPAP use > 4 hours. He has some residual daytime fatigue. Epworth 5. He was taking adderall for daytime functioning but no longer. Pressure 10-20cm h20 (16cm h20-95%); residual AHI 4.4/hr. No changes to pressure setting today. Recommend patient aim to wear CPAP every night for min 4-6 hours or longer. Encourage weight loss efforts.  Advised against driving if experiencing excessive daytime sleepiness. FU in 1 year with Dr. Wynona Melendez or sooner if needed.     Vitamin D deficiency - Continue Vit D2 50,000 IU    03/20/2023- interim hx  Discussed the use of AI scribe software for clinical note transcription with the patient, who gave verbal consent to proceed.  History of Present Illness   Patient presents for a one-year follow-up for severe obstructive sleep apnea diagnosed in 2022. He has been using a new CPAP machine due to a recall and reports consistent use with an average of six hours and twenty-seven minutes per night. However, he has been experiencing nasal congestion, which he believes is causing him to wake up at night.   Despite these measures, the patient reports that the congestion persists and sometimes becomes so severe that he has to remove the CPAP mask during the night. He has been using a full face mask and has not tried a nasal mask. He also reports taking a prescription allergy pill (cetirizine) and using Flonase nasal spray, but only when the congestion becomes severe.  The patient also mentions a significant weight gain over the years, which he attributes to decreased activity due to a knee injury. He has been managing the knee pain with pain pills and has been advised that knee replacement surgery may be necessary in the future. However, he expresses reluctance to undergo this procedure at this time.      Airview download 12/20/22- 03/19/23 90/90 days (100%);  87 days (97%) >4 hour Average usage 6 hour Pressure 10-20cm h20 Airleaks 16.1L/min (95%) AHI 3.0      Allergies  Allergen Reactions   Other Other (See Comments)    Pt does not remember name of medication but states began having lower extremity pain after cholesterol medicine.    Immunization History  Administered Date(s) Administered   Influenza,inj,Quad PF,6-35 Mos 02/28/2019   PFIZER(Purple Top)SARS-COV-2 Vaccination 12/28/2019,  01/20/2020   Tdap 09/28/2013    Past Medical History:  Diagnosis Date   Arthritis    Hypercholesteremia    Hypertension    Seasonal allergies     Tobacco History: Social History   Tobacco Use  Smoking Status Never  Smokeless Tobacco Never   Counseling given: Not Answered   Outpatient Medications Prior to Visit  Medication Sig Dispense Refill   allopurinol (ZYLOPRIM) 300 MG tablet Take 300 mg by mouth daily.     amLODipine-benazepril (LOTREL) 10-20 MG capsule Take 1 capsule by mouth daily.     carvedilol (COREG) 6.25 MG tablet Take 6.25 mg by mouth 2 (two) times daily.     chlorthalidone (HYGROTON) 25 MG tablet Take 25 mg by mouth daily.     clotrimazole-betamethasone (LOTRISONE) cream SMARTSIG:1 Topical Every Night     dexlansoprazole (DEXILANT) 60 MG capsule Take 60 mg by mouth daily.     fluticasone (FLONASE) 50 MCG/ACT nasal spray Place into both nostrils.     ibuprofen (ADVIL) 800 MG tablet Take 800 mg by mouth 3 (three) times daily.     methocarbamol (ROBAXIN) 500 MG tablet Take 1 tablet (500 mg total) by mouth 2 (two) times daily. 20 tablet 0   naproxen (NAPROSYN) 375 MG tablet Take 1 tablet (375 mg total) by mouth 2 (two) times daily with a meal. 30 tablet 0   oxyCODONE-acetaminophen (PERCOCET) 10-325 MG per tablet Take 1 tablet by mouth every 4 (four) hours as needed for pain.     PRESCRIPTION MEDICATION Take 1 tablet by mouth daily. Combo blood pressure medication     tamsulosin (FLOMAX) 0.4 MG CAPS capsule Take 0.4 mg by mouth daily.     Vitamin D, Ergocalciferol, (DRISDOL) 1.25 MG (50000 UNIT) CAPS capsule Take 50,000 Units by mouth once a week.     No facility-administered medications prior to visit.   Review of Systems  Review of Systems  Constitutional: Negative.   HENT:  Positive for congestion and postnasal drip.   Respiratory: Negative.    Musculoskeletal:  Positive for arthralgias.     Physical Exam  There were no vitals taken for this  visit. Physical Exam Constitutional:      General: He is not in acute distress.    Appearance: Normal appearance. He is not ill-appearing.  HENT:     Head: Normocephalic and atraumatic.     Mouth/Throat:     Mouth: Mucous membranes are moist.     Pharynx: Oropharynx is clear.  Cardiovascular:     Rate and Rhythm: Normal rate and regular rhythm.  Pulmonary:     Effort: Pulmonary effort is normal.     Breath sounds: Normal breath sounds.  Skin:    General: Skin is warm and dry.  Neurological:     General: No focal deficit present.     Mental Status: He is alert and oriented to person, place, and time. Mental status is at baseline.  Psychiatric:        Mood and Affect: Mood normal.  Behavior: Behavior normal.        Thought Content: Thought content normal.        Judgment: Judgment normal.      Lab Results:  CBC    Component Value Date/Time   WBC 12.5 (H) 07/24/2020 1030   RBC 4.87 07/24/2020 1030   HGB 14.8 07/24/2020 1030   HCT 44.7 07/24/2020 1030   PLT 240 07/24/2020 1030   MCV 91.8 07/24/2020 1030   MCH 30.4 07/24/2020 1030   MCHC 33.1 07/24/2020 1030   RDW 14.0 07/24/2020 1030   LYMPHSABS 2.3 07/24/2020 1030   MONOABS 0.9 07/24/2020 1030   EOSABS 0.6 (H) 07/24/2020 1030   BASOSABS 0.0 07/24/2020 1030    BMET No results found for: "NA", "K", "CL", "CO2", "GLUCOSE", "BUN", "CREATININE", "CALCIUM", "GFRNONAA", "GFRAA"  BNP No results found for: "BNP"  ProBNP No results found for: "PROBNP"  Imaging: No results found.   Assessment & Plan:   1. Obstructive sleep apnea - AMB REFERRAL FOR DME  Obstructive Sleep Apnea Severe OSA well controlled with CPAP 10-20cm h20 with residual AHI 3.0/hour. Patient reports nasal congestion causing intermittent discomfort with CPAP use. Currently using full face mask, Flonase, and Cetirizine for congestion. -Continue CPAP use nightly 4-6 hours or longer -Try Breathe Right strips for nasal congestion with CPAP   -Consider ENT consultation for upper airway evaluation and possible surgical options symptoms persist.  Weight Gain Patient reports significant weight gain over the years, impacting activity level and contributing to knee pain. -Encourage continued efforts for weight loss.    Glenford Bayley, NP 03/19/2023

## 2023-03-20 ENCOUNTER — Encounter: Payer: Self-pay | Admitting: Primary Care

## 2023-03-20 ENCOUNTER — Ambulatory Visit: Payer: BC Managed Care – PPO | Admitting: Primary Care

## 2023-03-20 VITALS — BP 125/75 | HR 97 | Temp 98.2°F | Ht 72.0 in | Wt 300.4 lb

## 2023-03-20 DIAGNOSIS — G4733 Obstructive sleep apnea (adult) (pediatric): Secondary | ICD-10-CM | POA: Diagnosis not present

## 2023-03-20 NOTE — Patient Instructions (Addendum)
-  OBSTRUCTIVE SLEEP APNEA: Obstructive sleep apnea is a condition where your breathing stops and starts during sleep due to blocked airways. Your condition is well controlled with your CPAP machine, but you are experiencing nasal congestion that disrupts your sleep. Continue using your CPAP machine nightly. Try using Breathe Right strips to help with nasal congestion. If the congestion continues, consider seeing an ENT specialist to check for any upper airway obstructions.  -WEIGHT GAIN: Weight gain can affect your overall health and contribute to other issues like knee pain. Continue your efforts to lose weight, as this can help improve your activity level and reduce knee pain.  -CHRONIC KNEE PAIN: Chronic knee pain can be due to various reasons, including injury or arthritis. You are currently managing the pain with medications. If the pain becomes unmanageable, consider discussing knee replacement surgery with an orthopedic surgeon in the future.  Orders: Renew CPAP supplies   Follow-up: 1 year follow-up with either [name] or Waynetta Sandy NP

## 2024-01-13 ENCOUNTER — Other Ambulatory Visit: Payer: Self-pay | Admitting: Urology

## 2024-01-13 DIAGNOSIS — R972 Elevated prostate specific antigen [PSA]: Secondary | ICD-10-CM

## 2024-01-14 ENCOUNTER — Encounter: Payer: Self-pay | Admitting: Urology

## 2024-02-19 ENCOUNTER — Encounter: Payer: Self-pay | Admitting: Cardiology

## 2024-02-19 ENCOUNTER — Ambulatory Visit: Payer: Self-pay | Attending: Internal Medicine | Admitting: Cardiology

## 2024-02-19 VITALS — BP 98/61 | HR 63 | Resp 16 | Ht 72.0 in | Wt 273.4 lb

## 2024-02-19 DIAGNOSIS — R9431 Abnormal electrocardiogram [ECG] [EKG]: Secondary | ICD-10-CM

## 2024-02-19 DIAGNOSIS — E7800 Pure hypercholesterolemia, unspecified: Secondary | ICD-10-CM | POA: Diagnosis not present

## 2024-02-19 DIAGNOSIS — E119 Type 2 diabetes mellitus without complications: Secondary | ICD-10-CM | POA: Diagnosis not present

## 2024-02-19 DIAGNOSIS — I10 Essential (primary) hypertension: Secondary | ICD-10-CM | POA: Diagnosis not present

## 2024-02-19 MED ORDER — AMLODIPINE BESY-BENAZEPRIL HCL 5-10 MG PO CAPS: 1.0000 | ORAL_CAPSULE | Freq: Every day | ORAL | 0 refills | Status: AC

## 2024-02-19 NOTE — Patient Instructions (Signed)
 Medication Instructions:  Your physician has recommended you make the following change in your medication:  ** Stop Amlodipine 10-20mg   **Begin Amlodipine-Benazapril 5-10mg  - 1 capsule daily  *If you need a refill on your cardiac medications before your next appointment, please call your pharmacy*  Lab Work: None ordered.  If you have labs (blood work) drawn today and your tests are completely normal, you will receive your results only by: MyChart Message (if you have MyChart) OR A paper copy in the mail If you have any lab test that is abnormal or we need to change your treatment, we will call you to review the results.  Testing/Procedures: Dr Ladona would like for you to be scheduled for:  Nuclear Exercise Myoview Stress Test.  Your physician has requested that you have an echocardiogram. Echocardiography is a painless test that uses sound waves to create images of your heart. It provides your doctor with information about the size and shape of your heart and how well your heart's chambers and valves are working. This procedure takes approximately one hour. There are no restrictions for this procedure. Please do NOT wear cologne, perfume, aftershave, or lotions (deodorant is allowed). Please arrive 15 minutes prior to your appointment time.  Please note: We ask at that you not bring children with you during ultrasound (echo/ vascular) testing. Due to room size and safety concerns, children are not allowed in the ultrasound rooms during exams. Our front office staff cannot provide observation of children in our lobby area while testing is being conducted. An adult accompanying a patient to their appointment will only be allowed in the ultrasound room at the discretion of the ultrasound technician under special circumstances. We apologize for any inconvenience.   Follow-Up: At [facility], you and your health needs are our priority.  As part of our continuing mission to provide  you with exceptional heart care, our providers are all part of one team.  This team includes your primary Cardiologist (physician) and Advanced Practice Providers or APPs (Physician Assistants and Nurse Practitioners) who all work together to provide you with the care you need, when you need it.  Your next appointment:   2 months with Dr Ganji

## 2024-02-19 NOTE — Progress Notes (Unsigned)
 Cardiology Office Note:  .   Date:  02/20/2024  ID:  Manuel Melendez, DOB August 12, 1960, MRN [id number] PCP: Manuel Ezekiel NOVAK, MD  [facility] Providers Cardiologist:  None   History of Present Illness: .   Manuel Melendez is a 63 y.o. African-American male patient with morbid obesity with obstructive sleep apnea on CPAP, hypertension, hypercholesterolemia, diabetes mellitus referred to me for evaluation of abnormal EKG.  Cardiac Studies relevent.    Screening ABI 11/10/2023: Right 1.20 left 1.13.    Discussed the use of AI scribe software for clinical note transcription with the patient, who gave verbal consent to proceed.  History of Present Illness Manuel Melendez is a 63 year old male with diabetes and hypertension who presents for evaluation of an abnormal EKG. He was referred by Dr. Heloise for evaluation of an abnormal EKG.  He has been managing diabetes with Mounjaro, resulting in a 30-pound weight loss, which has improved diabetes control and blood pressure. He aims to lose more weight. He experiences muscle and joint aches, initially thought to be related to cholesterol medication. His cholesterol was well controlled on a higher dose, but the dose was reduced due to these aches, yet the pain persisted. He uses a CPAP machine regularly for sleep apnea, with decreased monitoring since receiving a new machine during the pandemic. Current medications include amlodipine-benazepril 10/20 mg once daily, carvedilol 6.25 mg twice daily, and chlorthalidone once daily. He does not smoke.  Labs   Care everywhere/Faxed External Labs:  Labs 08/05/2023:  Total cholesterol 147, triglycerides 118, HDL 50, LDL 77.  Non-HDL cholesterol 97.  Urinary protein to creatinine ratio 2.0.  BUN 16, creatinine 1.03, eGFR 82 mL, potassium 3.5, LFTs normal.  Hb 15.2/HCT 45.1, platelets 217.  Labs 11/04/2023:  Total cholesterol 166, triglycerides 84, HDL 59, LDL 90.  A1c  6.0%.  ROS  Review of Systems  Cardiovascular:  Negative for chest pain, dyspnea on exertion and leg swelling.   Physical Exam:   VS:  BP 98/61 (BP Location: Left Arm, Patient Position: Sitting, Cuff Size: Large)   Pulse 63   Resp 16   Ht 6' (1.829 m)   Wt 273 lb 6.4 oz (124 kg)   SpO2 94%   BMI 37.08 kg/m    Wt Readings from Last 3 Encounters:  02/19/24 273 lb 6.4 oz (124 kg)  03/20/23 (!) 300 lb 6.4 oz (136.3 kg)  06/20/21 (!) 312 lb 3.2 oz (141.6 kg)    BP Readings from Last 3 Encounters:  02/19/24 98/61  03/20/23 125/75  06/20/21 130/86   Physical Exam Constitutional:      Appearance: He is obese.  Neck:     Vascular: No carotid bruit or JVD.  Cardiovascular:     Rate and Rhythm: Normal rate and regular rhythm.     Pulses: Intact distal pulses.     Heart sounds: Normal heart sounds. No murmur heard.    No gallop.  Pulmonary:     Effort: Pulmonary effort is normal.     Breath sounds: Normal breath sounds.  Abdominal:     General: Bowel sounds are normal.     Palpations: Abdomen is soft.  Musculoskeletal:     Right lower leg: No edema.     Left lower leg: No edema.    EKG:    EKG Interpretation Date/Time:  Thursday February 19 2024 09:00:07 EDT Ventricular Rate:  82 PR Interval:  208 QRS Duration:  112 QT Interval:  382 QTC Calculation: 446 R Axis:   -61  Text Interpretation: EKG 02/19/2024: Sinus rhythm with first-degree AV block at the rate of 82 bpm, left atrial enlargement, left anterior fascicular block.  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  LVH.  Nonspecific T abnormality.  Compared to PCP EKG from 11/10/2023, T wave inversion in the anterolateral leads and inferior leads suggestive of ischemia have not replaced by nonspecific T changes. Confirmed by Manuel Melendez, Manuel Melendez [phone]) on 02/19/2024 9:12:18 AM    EKG 11/10/2023: Sinus bradycardia with first-degree AV block at the rate of 56 bpm, left anterior fascicular block.  T wave inversion, anterolateral  ischemia, inferior ischemia.  Compared to 10/25/2020, T wave inversion slightly more pronounced.  ASSESSMENT AND PLAN: .      ICD-10-CM   1. Nonspecific abnormal electrocardiogram (ECG) (EKG)  R94.31 EKG 12-Lead    Cardiac Stress Test: Informed Consent Details: Physician/Practitioner Attestation; Transcribe to consent form and obtain patient signature    Myocardial Perfusion Imaging    ECHOCARDIOGRAM COMPLETE    2. Type 2 diabetes mellitus without complication, without long-term current use of insulin (HCC)  E11.9 Cardiac Stress Test: Informed Consent Details: Physician/Practitioner Attestation; Transcribe to consent form and obtain patient signature    3. Primary hypertension  I10 amLODipine-benazepril (LOTREL) 5-10 MG capsule    4. Pure hypercholesterolemia  E78.00      Assessment & Plan Abnormal electrocardiogram (ECG) ECG shows diffuse nonspecific T wave abnormalities, potentially related to hypertension, obesity, or other factors. Given his risk factors, including hypertension, hypercholesterolemia, diabetes mellitus, and age, there is concern for potential cardiac blockage. - Order nuclear stress test to assess for cardiac blockage - Order echocardiogram to evaluate cardiac function  Essential hypertension Blood pressure is currently 98/61 mmHg. He is on amlodipine-benazepril and carvedilol. The current regimen is effective, but the dose of amlodipine-benazepril will be adjusted. - Change amlodipine-benazepril dose to 5/10 mg once daily from 10/20 mg. Suspect this is due to weight loss.  - Continue carvedilol 6.25 mg twice daily - Ensure he carries a list of all medications  Pure hypercholesterolemia Cholesterol levels were well controlled on a higher dose of medication. Despite reducing the dose due to concerns about muscle aches, symptoms persisted, indicating the need to return to the higher dose for better cholesterol management. - Increase Crestor medication to previous  higher dose of 20 mg  Type 2 diabetes mellitus Diabetes is improving with weight loss and the use of Mounjaro. His body mass index has decreased from over 40 to 37. - Continue Mounjaro for diabetes management - Encourage continued weight loss  Obesity Significant weight loss of approximately 30 pounds has been achieved, reducing BMI from over 40 to 37. This has positively impacted his diabetes and blood pressure. - Encourage continued weight loss efforts  Obstructive sleep apnea, on CPAP He has been using CPAP for 3-4 years. Monitoring by the CPAP provider has decreased since receiving a new machine, but he continues to use it regularly. - Continue regular use of CPAP  Right shoulder pain and dysfunction, status post rotator cuff repair Chronic right shoulder pain and dysfunction following a rotator cuff repair 30 years ago. He is hesitant about further surgery due to concerns about outcomes. However, given the severity of dysfunction, a surgical evaluation is recommended. The stress test and echocardiogram will help determine surgical candidacy. - Refer to orthopedic surgeon in [facility] for evaluation as per his PCP physician choice - Determine surgical candidacy based on stress test and  echocardiogram results   Follow up: 2-3 months for eval and follow up of stress, echo and hypertension  Signed,  Gordy Bergamo, MD, [facility] 02/20/2024, 6:14 AM [facility] [address] [place], [place] [address] Phone: [phone]. Fax:  [phone]

## 2024-02-20 MED ORDER — ROSUVASTATIN CALCIUM 20 MG PO TABS: 20.0000 mg | ORAL_TABLET | Freq: Every day | ORAL | 0 refills | Status: DC

## 2024-02-25 ENCOUNTER — Other Ambulatory Visit: Payer: Self-pay | Admitting: Cardiology

## 2024-03-15 ENCOUNTER — Ambulatory Visit
Admission: RE | Admit: 2024-03-15 | Discharge: 2024-03-15 | Disposition: A | Discharge status: Home / Self Care | Source: Ambulatory Visit | Attending: Urology | Admitting: Urology

## 2024-03-15 DIAGNOSIS — R972 Elevated prostate specific antigen [PSA]: Secondary | ICD-10-CM

## 2024-03-15 MED ORDER — GADOPICLENOL 0.5 MMOL/ML IV SOLN
10.0000 mL | Freq: Once | INTRAVENOUS | Status: AC | PRN
  Administered 2024-03-15: 10 mL via INTRAVENOUS

## 2024-03-18 ENCOUNTER — Telehealth: Payer: Self-pay

## 2024-03-18 NOTE — Telephone Encounter (Signed)
 Pt is scheduled to see Almarie Ferrari, NP tomorrow for a f/u. I could not find pt in airview. 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 I see that we had an order back in 2022 but he was not eligible for a new machine back then. I have sent an email to the [facility] branch where he is serviced to see if any data is showing or if they can send me a compliance report.

## 2024-03-19 ENCOUNTER — Encounter: Payer: Self-pay | Admitting: Primary Care

## 2024-03-19 ENCOUNTER — Ambulatory Visit: Payer: BC Managed Care – PPO | Admitting: Primary Care

## 2024-03-19 VITALS — BP 128/66 | HR 71 | Temp 97.7°F | Ht 72.0 in | Wt 288.0 lb

## 2024-03-19 DIAGNOSIS — E669 Obesity, unspecified: Secondary | ICD-10-CM

## 2024-03-19 DIAGNOSIS — G4733 Obstructive sleep apnea (adult) (pediatric): Secondary | ICD-10-CM

## 2024-03-19 DIAGNOSIS — Z6839 Body mass index (BMI) 39.0-39.9, adult: Secondary | ICD-10-CM | POA: Diagnosis not present

## 2024-03-19 NOTE — Progress Notes (Signed)
 @Patient  ID: Manuel Melendez, male    DOB: 10-07-1960, 63 y.o.   MRN: [id number]  Chief Complaint  Patient presents with   Obstructive Sleep Apnea    Cpap f/u    Referring provider: Roanna Ezekiel NOVAK, MD  HPI: 63 year old male, never smoked. PMH significant for OSA, hypertension. Patient of Dr. Neda.   Previous [facility] pulmonary encounter: 11/26/2019 Patient presents today for 3 month follow-up. He reports compliance with CPAP but states that he hasn't used it the last 4-5 days because his needs a new mask. He has not been sleeping well without CPAP. Appeared tired today. States that he hasn't received new supplies in 1 year. He uses a full face mask. Reports that he is experiencing air leak from mask. DME company is [employer].   06/18/2021 Patient presents today for overdue OSA follow-up. He was seen by another provider with [facility] once in December 2021, ordered new CPAP supplies and was started on adderall 10mg  TID for daytime functioning. He tells me he went a year without using CPAP d/t recall. He had a repeat sleep study in April 2022 that showed severe obstructive sleep apnea, AHI 66.4/hr. He received new CPAP back in November d/t machine recall. He is 72% compliant with use > 4 hours. Average usage 4 hours 37 mins. He has some daytime fatigue despite wearing CPAP. PMP reviewed and patient is no longer taking adderall. PCP checked some labs and his vit D was low.   [facility] download 03/26/21-06/12/21 76/79 days (96%); 57 (72%) > 4 hours  Average usage 4 hours 37 mins Pressure 10-20cm h20 (16cm h20-95%) Airleaks 8.5L/min AHI 4.4   Obstructive sleep apnea - Patient is 72% compliant with CPAP use > 4 hours. He has some residual daytime fatigue. Epworth 5. He was taking adderall for daytime functioning but no longer. Pressure 10-20cm h20 (16cm h20-95%); residual AHI 4.4/hr. No changes to pressure setting today. Recommend patient aim to wear CPAP every night for min  4-6 hours or longer. Encourage weight loss efforts. Advised against driving if experiencing excessive daytime sleepiness. FU in 1 year with Dr. Neda or sooner if needed.     Vitamin D deficiency - Continue Vit D2 50,000 IU    03/20/2023 Discussed the use of AI scribe software for clinical note transcription with the patient, who gave verbal consent to proceed.  History of Present Illness   Patient presents for a one-year follow-up for severe obstructive sleep apnea diagnosed in 2022. He has been using a new CPAP machine due to a recall and reports consistent use with an average of six hours and twenty-seven minutes per night. However, he has been experiencing nasal congestion, which he believes is causing him to wake up at night.   Despite these measures, the patient reports that the congestion persists and sometimes becomes so severe that he has to remove the CPAP mask during the night. He has been using a full face mask and has not tried a nasal mask. He also reports taking a prescription allergy pill (cetirizine) and using Flonase nasal spray, but only when the congestion becomes severe.  The patient also mentions a significant weight gain over the years, which he attributes to decreased activity due to a knee injury. He has been managing the knee pain with pain pills and has been advised that knee replacement surgery may be necessary in the future. However, he expresses reluctance to undergo this procedure at this time.  [employer] download 12/20/22- 03/19/23 90/90 days (100%); 87 days (97%) >4 hour Average usage 6 hour Pressure 10-20cm h20 Airleaks 16.1L/min (95%) AHI 3.0    03/19/2024- Interim hx  Discussed the use of AI scribe software for clinical note transcription with the patient, who gave verbal consent to proceed.  History of Present Illness Manuel Melendez is a 63 year old male with severe sleep apnea who presents for follow-up regarding CPAP compliance and  effectiveness.  He uses an auto CPAP machine with settings of 10 to 20 cm H2O, maintaining 100% compliance with an average use of six hours and seven minutes per night. Despite this, he feels the CPAP is less effective, experiencing decreased energy levels and not waking up as refreshed as before. His current CPAP machine is three years old, and a previous recall delayed its acquisition.  He is on Mounjaro 5 mg for weight loss, having lost approximately 30 pounds but recently regaining 8 to 9 pounds. His weight was 304 pounds in 2020 and is currently 288 pounds. No side effects such as nausea, vomiting, or diarrhea are reported.  He is facing issues with CPAP supply costs due to lack of insurance coverage, resulting in significant out-of-pocket expenses.     Allergies  Allergen Reactions   Other Other (See Comments)    Pt does not remember name of medication but states began having lower extremity pain after cholesterol medicine.    Immunization History  Administered Date(s) Administered   Influenza,inj,Quad PF,6-35 Mos 02/28/2019   PFIZER(Purple Top)SARS-COV-2 Vaccination 12/28/2019, 01/20/2020   Tdap 09/28/2013    Past Medical History:  Diagnosis Date   Arthritis    Hypercholesteremia    Hypertension    Seasonal allergies     Tobacco History: Social History   Tobacco Use  Smoking Status Never  Smokeless Tobacco Never   Counseling given: Not Answered   Outpatient Medications Prior to Visit  Medication Sig Dispense Refill   amLODipine -benazepril  (LOTREL) 5-10 MG capsule Take 1 capsule by mouth daily. 90 capsule 0   carvedilol (COREG) 6.25 MG tablet Take 6.25 mg by mouth 2 (two) times daily.     cetirizine (ZYRTEC) 10 MG tablet Take 10 mg by mouth daily.     chlorthalidone (HYGROTON) 25 MG tablet Take 25 mg by mouth daily.     clotrimazole-betamethasone (LOTRISONE) cream SMARTSIG:1 Topical Every Night     fluticasone (FLONASE) 50 MCG/ACT nasal spray Place into Melendez  nostrils.     ibuprofen  (ADVIL ) 800 MG tablet Take 800 mg by mouth 3 (three) times daily.     loratadine-pseudoephedrine (CLARITIN-D 24-HOUR) 10-240 MG 24 hr tablet Take 1 tablet by mouth daily.     methocarbamol  (ROBAXIN ) 500 MG tablet Take 1 tablet (500 mg total) by mouth 2 (two) times daily. 20 tablet 0   MOUNJARO 5 MG/0.5ML Pen Inject 5 mg into the skin once a week.     oxyCODONE -acetaminophen  (PERCOCET) 10-325 MG per tablet Take 1 tablet by mouth every 4 (four) hours as needed for pain.     pantoprazole (PROTONIX) 40 MG tablet Take 40 mg by mouth daily.     rosuvastatin  (CRESTOR ) 20 MG tablet TAKE 1 TABLET BY MOUTH EVERY DAY 90 tablet 3   tamsulosin (FLOMAX) 0.4 MG CAPS capsule Take 0.4 mg by mouth daily.     No facility-administered medications prior to visit.    Review of Systems  Review of Systems  Constitutional:  Positive for fatigue.  HENT: Negative.  Respiratory: Negative.     Physical Exam  BP 128/66   Pulse 71   Temp 97.7 F (36.5 C)   Ht 6' (1.829 m)   Wt 288 lb (130.6 kg)   SpO2 98% Comment: ra  BMI 39.06 kg/m  Physical Exam Constitutional:      Appearance: Normal appearance. He is well-developed. He is obese.  HENT:     Head: Normocephalic and atraumatic.     Mouth/Throat:     Mouth: Mucous membranes are moist.     Pharynx: Oropharynx is clear.  Cardiovascular:     Rate and Rhythm: Normal rate and regular rhythm.     Heart sounds: Normal heart sounds.  Pulmonary:     Effort: Pulmonary effort is normal. No respiratory distress.     Breath sounds: Normal breath sounds. No wheezing or rhonchi.  Musculoskeletal:        General: Normal range of motion.     Cervical back: Normal range of motion and neck supple.  Skin:    General: Skin is warm and dry.     Findings: No erythema or rash.  Neurological:     General: No focal deficit present.     Mental Status: He is alert and oriented to person, place, and time. Mental status is at baseline.   Psychiatric:        Mood and Affect: Mood normal.        Behavior: Behavior normal.        Thought Content: Thought content normal.        Judgment: Judgment normal.     Lab Results:  CBC    Component Value Date/Time   WBC 12.5 (H) 07/24/2020 1030   RBC 4.87 07/24/2020 1030   HGB 14.8 07/24/2020 1030   HCT 44.7 07/24/2020 1030   PLT 240 07/24/2020 1030   MCV 91.8 07/24/2020 1030   MCH 30.4 07/24/2020 1030   MCHC 33.1 07/24/2020 1030   RDW 14.0 07/24/2020 1030   LYMPHSABS 2.3 07/24/2020 1030   MONOABS 0.9 07/24/2020 1030   EOSABS 0.6 (H) 07/24/2020 1030   BASOSABS 0.0 07/24/2020 1030    BMET No results found for: NA, K, CL, CO2, GLUCOSE, BUN, CREATININE, CALCIUM , GFRNONAA, GFRAA  BNP No results found for: BNP  ProBNP No results found for: PROBNP  Imaging: MR PROSTATE W WO CONTRAST Result Date: 03/16/2024 CLINICAL DATA:  Elevated PSA level. R97.20. Benign biopsy 03/16/2019. EXAM: MR PROSTATE WITHOUT AND WITH CONTRAST TECHNIQUE: Multiplanar multisequence MRI images were obtained of the pelvis centered about the prostate. Pre and post contrast images were obtained. CONTRAST:  10 cc Vueway  COMPARISON:  08/13/2021 FINDINGS: Prostate: Encapsulated nodularity in the transition zone compatible with benign prostatic hypertrophy. No focal lesion of intermediate or higher suspicion for prostate cancer is identified. Volume: 3D volumetric analysis: Prostate volume 133.2 cc (6.4 by 5.6 by 7.4 cm). Transcapsular spread: Absent Seminal vesicle involvement: Absent Neurovascular bundle involvement: Absent Pelvic adenopathy: Absent Bone metastasis: Absent Other findings: Postoperative findings from previous left groin hernia repair. Substantial degenerative findings in the pubis with associated spurring, degenerative subcortical cyst formation, fluid signal along the pubic symphysis, and a small cystic lesion adjacent to the pubis along the right hip adductor musculature  measuring 0.8 cm in diameter on image 30 series 8. Mild proximal right hamstring tendinopathy. IMPRESSION: 1. No focal lesion of intermediate or higher suspicion for prostate cancer is identified. 2. Prostatomegaly and benign prostatic hypertrophy. 3. Substantial degenerative findings in the pubis with associated  spurring, degenerative subcortical cyst formation, fluid signal along the pubic symphysis, and a small cystic lesion adjacent to the pubis along the right hip adductor musculature measuring 0.8 cm in diameter. 4. Mild proximal right hamstring tendinopathy. Electronically Signed   By: Ryan Salvage M.D.   On: 03/16/2024 07:53     Assessment & Plan:    1. Obstructive sleep apnea (Primary) - Ambulatory Referral for DME  Assessment and Plan Assessment & Plan Obstructive sleep apnea, well controlled on CPAP Severe obstructive sleep apnea is well controlled with current CPAP auto settings 10-20cm h20. Apnea-hypopnea index (AHI) is 2.5 events per hour, improved from over 30 events per hour during the sleep study in 2020. CPAP usage is 100% with an average of 6 hours and 7 minutes per night. Reports feeling less energetic, but this is not attributed to sleep apnea as it is well controlled. Advise patient discuss alternative causes for fatigue with PCP.  - Enrolled in [facility] for CPAP monitoring. - Renewed CPAP supplies prescription. - Advised to discuss with primary care physician regarding potential evaluation for other causes of fatigue.  Obesity BMI is 39, indicating obesity. Weight has decreased from 304 lbs in 2020 to 288 lbs currently. Currently on Mounjaro 5 mg for weight loss, with a reported weight loss of approximately 30 lbs. No significant side effects reported. Increasing the dose may further aid in weight loss and potentially reduce apneic events. - Discuss with prescribing physician about increasing Mounjaro dose to 7.5 mg for maximal weight loss (target dose 10-15mg ) -  Encouraged continued weight loss efforts to potentially reduce apneic events.   Almarie LELON Ferrari, NP 03/19/2024

## 2024-03-19 NOTE — Patient Instructions (Addendum)
  VISIT SUMMARY: Today, we discussed your current treatment for severe sleep apnea and your progress with weight loss. You are using your CPAP machine consistently, but you have noticed a decrease in energy levels. We also reviewed your weight loss journey and the challenges you are facing with CPAP supply costs.  YOUR PLAN: -OBSTRUCTIVE SLEEP APNEA: Obstructive sleep apnea is a condition where your airway becomes blocked during sleep, causing breathing pauses. Your condition is well controlled with your current CPAP settings, and your apnea-hypopnea index (AHI) has significantly improved. We have renewed your CPAP supplies prescription and have asked your medical supply store enroll you in [facility] for CPAP monitoring. Please discuss with your primary care physician about evaluating other potential causes of your fatigue, such as lifestyle factors, weight, thyroid function, vitamin levels, and testosterone levels.  -OBESITY: Obesity is a condition where you have an excessive amount of body fat. Your BMI is currently 39, and you have successfully lost weight from 304 lbs to 288 lbs. You are taking Mounjaro 5 mg for weight loss, and we recommend discussing with your prescribing physician about increasing the dose to 7.5mg  (target dose 10-15 mg) to help with further weight loss. Continued weight loss efforts may also help reduce your sleep apnea symptoms.  INSTRUCTIONS: Please follow up with your primary care physician to discuss potential evaluations for your fatigue and the possibility of increasing your Mounjaro dose for weight loss. Continue using your CPAP machine as prescribed and maintain your weight loss efforts.  Follow-up 1 year with [facility] NP or sooner if needed

## 2024-03-29 ENCOUNTER — Other Ambulatory Visit: Payer: Self-pay | Admitting: Cardiology

## 2024-03-29 ENCOUNTER — Telehealth (HOSPITAL_COMMUNITY): Payer: Self-pay | Admitting: *Deleted

## 2024-03-29 ENCOUNTER — Encounter (HOSPITAL_COMMUNITY): Payer: Self-pay | Admitting: *Deleted

## 2024-03-29 DIAGNOSIS — R9431 Abnormal electrocardiogram [ECG] [EKG]: Secondary | ICD-10-CM

## 2024-03-29 NOTE — Telephone Encounter (Signed)
 Attempted to call patient regarding upcoming stress test, no answer, unable to leave a message. Letter sent with instructions to my chart.  Manuel Melendez

## 2024-03-31 ENCOUNTER — Ambulatory Visit: Payer: Self-pay | Admitting: Cardiology

## 2024-03-31 ENCOUNTER — Ambulatory Visit (HOSPITAL_COMMUNITY)
Admission: RE | Admit: 2024-03-31 | Discharge: 2024-03-31 | Disposition: A | Source: Ambulatory Visit | Attending: Cardiology | Admitting: Cardiology

## 2024-03-31 DIAGNOSIS — R9431 Abnormal electrocardiogram [ECG] [EKG]: Secondary | ICD-10-CM | POA: Insufficient documentation

## 2024-03-31 LAB — MYOCARDIAL PERFUSION IMAGING
Angina Index: 0
Duke Treadmill Score: 4
Estimated workload: 4.8
Exercise duration (min): 4 min
Exercise duration (sec): 0 s
LV dias vol: 171 mL (ref 62–150)
LV sys vol: 80 mL (ref 4.2–5.8)
MPHR: 157 {beats}/min
Nuc Stress EF: 53 %
Peak HR: 139 {beats}/min
Percent HR: 88 %
Rest HR: 84 {beats}/min
Rest Nuclear Isotope Dose: 13 mCi
SDS: 2
SRS: 2
SSS: 4
ST Depression (mm): 0 mm
Stress Nuclear Isotope Dose: 37.1 mCi
TID: 0.99

## 2024-03-31 LAB — ECHOCARDIOGRAM COMPLETE
Area-P 1/2: 3.6 cm2
S' Lateral: 3.6 cm

## 2024-03-31 MED ORDER — TECHNETIUM TC 99M TETROFOSMIN IV KIT
37.1000 | PACK | Freq: Once | INTRAVENOUS | Status: AC | PRN
  Administered 2024-03-31: 37.1 via INTRAVENOUS

## 2024-03-31 MED ORDER — PERFLUTREN LIPID MICROSPHERE
1.0000 mL | INTRAVENOUS | Status: DC | PRN
  Administered 2024-03-31: 2 mL via INTRAVENOUS

## 2024-03-31 MED ORDER — TECHNETIUM TC 99M TETROFOSMIN IV KIT
13.0000 | PACK | Freq: Once | INTRAVENOUS | Status: AC | PRN
  Administered 2024-03-31: 13 via INTRAVENOUS

## 2024-03-31 NOTE — Progress Notes (Signed)
 Your echocardiogram and also nuclear stress test revealed mildly decreased heart function and echocardiogram reveals local regional decreased function of apex of the heart suggesting that you may need further evaluation.  The stress test itself did not show any significant blockages.  You have an appointment to see me soon and I will discuss this with you soon.

## 2024-04-01 ENCOUNTER — Ambulatory Visit: Payer: Self-pay | Admitting: Cardiology

## 2024-04-01 NOTE — Progress Notes (Signed)
 Abnormal renal mass, will need follow-up with PCP.  Will forward results to Dr. Roanna

## 2024-04-03 NOTE — Progress Notes (Signed)
 Your echocardiogram and also nuclear stress test revealed mildly decreased heart function and echocardiogram reveals local regional decreased function of apex of the heart suggesting that you may need further evaluation.  The stress test itself did not show any significant blockages.  You have an appointment to see me soon and I will discuss this with you soon.

## 2024-04-06 NOTE — Progress Notes (Signed)
 Letter sent to patient to call the office to discuss

## 2024-04-16 ENCOUNTER — Emergency Department (HOSPITAL_COMMUNITY)
Admission: EM | Admit: 2024-04-16 | Discharge: 2024-04-16 | Disposition: A | Discharge status: Home / Self Care | Attending: Emergency Medicine | Admitting: Emergency Medicine

## 2024-04-16 ENCOUNTER — Other Ambulatory Visit: Payer: Self-pay

## 2024-04-16 ENCOUNTER — Emergency Department (HOSPITAL_COMMUNITY)

## 2024-04-16 DIAGNOSIS — S39012A Strain of muscle, fascia and tendon of lower back, initial encounter: Secondary | ICD-10-CM | POA: Insufficient documentation

## 2024-04-16 DIAGNOSIS — I10 Essential (primary) hypertension: Secondary | ICD-10-CM | POA: Diagnosis not present

## 2024-04-16 MED ORDER — ACETAMINOPHEN 325 MG PO TABS
650.0000 mg | ORAL_TABLET | Freq: Once | ORAL | Status: AC
  Administered 2024-04-16: 650 mg via ORAL
  Filled 2024-04-16: qty 2

## 2024-04-16 MED ORDER — LIDOCAINE 5 % EX PTCH
1.0000 | MEDICATED_PATCH | CUTANEOUS | Status: DC
  Administered 2024-04-16: 1 via TRANSDERMAL
  Filled 2024-04-16: qty 1

## 2024-04-16 NOTE — Discharge Instructions (Signed)
 Please follow-up with your primary care provider in the next 48-72 hours.  Seek emergency care if experiencing any new or worsening symptoms such as numbness or paraplegia of lower extremities.
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
## 2024-05-12 NOTE — Progress Notes (Signed)
 " Cardiology Office Note:  .   Date:  05/13/2024  ID:  Manuel Melendez, DOB [birth year], MRN [id number] PCP: Roanna Ezekiel NOVAK, MD  [facility] Providers Cardiologist:  Gordy Bergamo, MD   History of Present Illness: .   Per Manuel Melendez is a 64 y.o. African-American male patient with morbid obesity with obstructive sleep apnea on CPAP, hypertension, hypercholesterolemia, diabetes mellitus who was initially evaluated by me on 02/19/2024 for abnormal EKG with nonspecific T abnormalities, in view of multiple cardiovascular risk factors underwent nuclear stress test and echocardiogram who presents for follow-up.  He remains asymptomatic.  No change in his weight although he did not he has lost significant amount of weight previously but has gained some weight back up to his mom's demise 6 months ago.  He has been compliant with CPAP.    Discussed the use of AI scribe software for clinical note transcription with the patient, who gave verbal consent to proceed.  History of Present Illness Manuel Melendez is a 64 year old male with high blood pressure, diabetes, and obesity who presents for follow-up after an abnormal EKG.  He has hypertension, diabetes, and obesity with recent testing that showed coronary plaque and calcification and an ejection fraction of 45-50%.  He gained weight after multiple orthopedic surgeries but is working on weight loss and pool-based exercise. His eating habits worsened after his mother's death but he is trying to return to healthier patterns.  He takes Jardiance for diabetes, rosuvastatin  20 mg and Zetia 10 mg for lipids, and amlodipine , benazepril , carvedilol, and chlorthalidone for blood pressure. His blood pressure is well controlled.  He has obstructive sleep apnea on CPAP for almost five years and wakes feeling in a daze. A sleep physician recommended reassessment with a possible new machine and repeat sleep study. He is documented as 72%  compliant with CPAP, though he believes his use is consistent.  Cardiac Studies relevent.    Cardiac Studies & Procedures   ______________________________________________________________________________________________  STRESS TESTS  MYOCARDIAL PERFUSION IMAGING 03/31/2024    The study is normal with no evidence of perfusion defect. The study is intermediate risk due to depressed ejection fraction.   No ST deviation was noted.   Left ventricular function is abnormal. Nuclear stress EF: 53%. The left ventricular ejection fraction is mildly decreased (45-54%). End diastolic cavity size is mildly enlarged. End systolic cavity size is normal.   CT images were obtained for attenuation correction and were examined for the presence of coronary calcium  when appropriate.   Coronary calcium  was present on the attenuation correction CT images. Mild coronary calcifications were present. Coronary calcifications were present in the left anterior descending artery distribution(s).   ECHOCARDIOGRAM COMPLETE 03/31/2024 1. Left ventricular ejection fraction, by estimation, is 45 to 50%. The left ventricle has mildly decreased function. The left ventricle demonstrates regional wall motion abnormalities. The apical septal segment and apex are hypokinetic. There is mild concentric left ventricular hypertrophy. Left ventricular diastolic parameters are consistent with Grade I diastolic dysfunction (impaired relaxation). 2. Right ventricular systolic function is normal. The right ventricular size is normal. There is normal pulmonary artery systolic pressure. The estimated right ventricular systolic pressure is 27.7 mmHg. 3. The aortic valve is tricuspid. There is mild calcification of the aortic valve. Aortic valve regurgitation is not visualized. Aortic valve sclerosis/calcification is present, without any evidence of aortic stenosis. 4. There is mild dilatation of the aortic root and of the ascending aorta, measuring  40 mm. __________________________________________________________________________________________  EKG:      Labs   Care everywhere/Faxed External Labs:  Labs 08/05/2023:   Total cholesterol 147, triglycerides 118, HDL 50, LDL 77.  Non-HDL cholesterol 97.   Urinary protein to creatinine ratio 2.0.   BUN 16, creatinine 1.03, eGFR 82 mL, potassium 3.5, LFTs normal.   Hb 15.2/HCT 45.1, platelets 217.   Labs 11/04/2023:   Total cholesterol 166, triglycerides 84, HDL 59, LDL 90.   A1c 6.0%  ROS  Review of Systems  Cardiovascular:  Negative for chest pain, dyspnea on exertion and leg swelling.   Physical Exam:   VS:  BP 120/72 (BP Location: Left Arm, Patient Position: Sitting, Cuff Size: Large)   Pulse 80   Resp 16   Ht 6' (1.829 m)   Wt 284 lb 12.8 oz (129.2 kg)   SpO2 97%   BMI 38.63 kg/m    Wt Readings from Last 3 Encounters:  05/13/24 284 lb 12.8 oz (129.2 kg)  03/19/24 288 lb (130.6 kg)  02/19/24 273 lb 6.4 oz (124 kg)    BP Readings from Last 3 Encounters:  05/13/24 120/72  04/16/24 (!) 138/97  03/19/24 128/66   Physical Exam Constitutional:      Appearance: He is obese.  Neck:     Vascular: No carotid bruit or JVD.  Cardiovascular:     Rate and Rhythm: Normal rate and regular rhythm.     Pulses: Intact distal pulses.     Heart sounds: Normal heart sounds. No murmur heard.    No gallop.  Pulmonary:     Effort: Pulmonary effort is normal.     Breath sounds: Normal breath sounds.  Abdominal:     General: Bowel sounds are normal.     Palpations: Abdomen is soft.  Musculoskeletal:     Right lower leg: No edema.     Left lower leg: No edema.     ASSESSMENT AND PLAN: .      ICD-10-CM   1. Non-ischemic cardiomyopathy (HCC)  I42.8 ECHOCARDIOGRAM COMPLETE    2. Chronic heart failure with mildly reduced ejection fraction (HFmrEF, 41-49%) (HCC)  I50.22 ECHOCARDIOGRAM COMPLETE    3. Pure hypercholesterolemia  E78.00     4. Primary hypertension  I10      5. Class 2 severe obesity due to excess calories with serious comorbidity and body mass index (BMI) of 38.0 to 38.9 in adult  E66.812    Z68.38      Assessment & Plan Non-ischemic cardiomyopathy with chronic heart failure and mildly reduced ejection fraction (EF 41-49%) Nuclear stress test revealing no evidence of ischemia.  Heart function is mildly reduced with an ejection fraction of 45%, below the normal range of 55-60%. No current heart failure symptoms. Condition is reversible with lifestyle changes and medication adjustments. Emphasis on weight loss and dietary changes to prevent further decline in heart function. - Continue current heart failure medications: amlodipine , benazepril , carvedilol, chlorthalidone. - Encouraged weight loss through diet and exercise. - Will repeat echocardiogram in six months to monitor heart function.  Coronary artery disease with coronary calcification Presence of coronary calcification noted on CT scan. No blockages detected. Condition is manageable with lifestyle modifications and medication adherence. - Continue current medications for coronary artery disease.  Primary hypertension Blood pressure is well controlled at 120/72 mmHg with current medication regimen. - Continue current antihypertensive medications: amlodipine , benazepril , carvedilol, chlorthalidone.  Pure hypercholesterolemia LDL cholesterol is 77 mg/dL, above the target of less than 70 mg/dL. Currently on rosuvastatin  and  recently started on ezetimibe. Further reduction in LDL is necessary to reduce cardiovascular risk. - Continue rosuvastatin  and ezetimibe. - Patient will follow-up with his PCP for lipid management.  Class 2 severe obesity (BMI 38-39) BMI is 38-39, indicating severe obesity. Weight loss is crucial for improving overall health and reducing cardiovascular risk. Discussed potential use of GLP-1 agonists for weight loss and heart protection. - Encouraged weight loss  through diet and exercise. - Discussed potential use of GLP-1 agonists (Mounjaro, Ozempic, Vagobi) with primary care physician.  Type 2 diabetes mellitus Diabetes is managed with Jardiance. Weight loss and GLP-1 agonists may aid in better glycemic control. - Continue Jardiance. - Discuss potential use of GLP-1 agonists with primary care physician.  Obstructive sleep apnea Using CPAP regularly but reports feeling dazed upon waking. Compliance with CPAP is 72%, below the recommended 90%. Possible need for a new CPAP machine or repeat sleep study. - Sent message to sleep doctor to assess need for repeat sleep study or new CPAP machine. - Encouraged weight loss to improve sleep apnea symptoms.  Follow up: 6 months NICM, F/U Echo   Signed,  Gordy Bergamo, MD, [facility] 05/13/2024, 9:22 PM [facility] [address] [place], [place] [address] Phone: [phone]. Fax:  [phone]  "

## 2024-05-13 ENCOUNTER — Encounter: Payer: Self-pay | Admitting: Cardiology

## 2024-05-13 ENCOUNTER — Ambulatory Visit: Attending: Cardiology | Admitting: Cardiology

## 2024-05-13 VITALS — BP 120/72 | HR 80 | Resp 16 | Ht 72.0 in | Wt 284.8 lb

## 2024-05-13 DIAGNOSIS — I428 Other cardiomyopathies: Secondary | ICD-10-CM

## 2024-05-13 DIAGNOSIS — I5022 Chronic systolic (congestive) heart failure: Secondary | ICD-10-CM

## 2024-05-13 DIAGNOSIS — I10 Essential (primary) hypertension: Secondary | ICD-10-CM

## 2024-05-13 DIAGNOSIS — Z6838 Body mass index (BMI) 38.0-38.9, adult: Secondary | ICD-10-CM | POA: Diagnosis not present

## 2024-05-13 DIAGNOSIS — E66812 Obesity, class 2: Secondary | ICD-10-CM

## 2024-05-13 DIAGNOSIS — E7800 Pure hypercholesterolemia, unspecified: Secondary | ICD-10-CM

## 2024-05-13 DIAGNOSIS — R9431 Abnormal electrocardiogram [ECG] [EKG]: Secondary | ICD-10-CM

## 2024-05-13 DIAGNOSIS — E119 Type 2 diabetes mellitus without complications: Secondary | ICD-10-CM

## 2024-05-13 NOTE — Patient Instructions (Signed)
 Medication Instructions:  Your physician recommends that you continue on your current medications as directed. Please refer to the Current Medication list given to you today.  *If you need a refill on your cardiac medications before your next appointment, please call your pharmacy*  Testing/Procedures: Your physician has requested that you have an echocardiogram. Echocardiography is a painless test that uses sound waves to create images of your heart. It provides your doctor with information about the size and shape of your heart and how well your hearts chambers and valves are working. This procedure takes approximately one hour. There are no restrictions for this procedure. Please do NOT wear cologne, perfume, aftershave, or lotions (deodorant is allowed). Please arrive 15 minutes prior to your appointment time.  Please note: We ask at that you not bring children with you during ultrasound (echo/ vascular) testing. Due to room size and safety concerns, children are not allowed in the ultrasound rooms during exams. Our front office staff cannot provide observation of children in our lobby area while testing is being conducted. An adult accompanying a patient to their appointment will only be allowed in the ultrasound room at the discretion of the ultrasound technician under special circumstances. We apologize for any inconvenience.   Follow-Up: At [facility], you and your health needs are our priority.  As part of our continuing mission to provide you with exceptional heart care, our providers are all part of one team.  This team includes your primary Cardiologist (physician) and Advanced Practice Providers or APPs (Physician Assistants and Nurse Practitioners) who all work together to provide you with the care you need, when you need it.  Your next appointment:   6 month(s)  Provider:   Gordy Bergamo, MD    We recommend signing up for the patient portal called MyChart.  Sign up  information is provided on this After Visit Summary.  MyChart is used to connect with patients for Virtual Visits (Telemedicine).  Patients are able to view lab/test results, encounter notes, upcoming appointments, etc.  Non-urgent messages can be sent to your provider as well.   To learn more about what you can do with MyChart, go to [email or web address].

## 2024-05-14 ENCOUNTER — Telehealth: Payer: Self-pay

## 2024-05-14 NOTE — Telephone Encounter (Signed)
 ATC X2. Unable to lvm as vm is not set up yet. Pt needs an appt with Almarie Ferrari, NP within the next week or 2 to discuss a possible repeated sleep study per Dr Gordy Bergamo in a secure chat with Almarie Ferrari, NP.

## 2024-11-10 ENCOUNTER — Ambulatory Visit (HOSPITAL_COMMUNITY)
